# Patient Record
Sex: Female | Born: 1995
Health system: Southern US, Community
[De-identification: ages and names within clinical notes are randomized; demographics above are authoritative.]

## PROBLEM LIST (undated history)

## (undated) DIAGNOSIS — O039 Complete or unspecified spontaneous abortion without complication: Secondary | ICD-10-CM

## (undated) DIAGNOSIS — Z20828 Contact with and (suspected) exposure to other viral communicable diseases: Secondary | ICD-10-CM

## (undated) HISTORY — PX: WISDOM TOOTH EXTRACTION: SHX21

## (undated) HISTORY — DX: Complete or unspecified spontaneous abortion without complication: O03.9

---
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
## 2013-01-27 ENCOUNTER — Telehealth: Payer: Self-pay | Admitting: Family Medicine

## 2013-01-27 NOTE — Telephone Encounter (Signed)
Feel free to go ahead and write this out on a prescription. I will sign it. Then fax it with a brief cover letter stating that we have given her the shots in the past and we are requesting that [facility] assume the duty of this

## 2013-01-27 NOTE — Telephone Encounter (Signed)
Patient needs Rx for her depo provera sent to [employer]     [phone]

## 2013-01-28 NOTE — Telephone Encounter (Signed)
rx faxed  Pt notified

## 2013-04-15 ENCOUNTER — Telehealth: Payer: Self-pay | Admitting: Family Medicine

## 2013-04-15 ENCOUNTER — Other Ambulatory Visit: Payer: Self-pay | Admitting: *Deleted

## 2013-04-15 NOTE — Telephone Encounter (Signed)
Notified patient via VM stating we sent in the med. 

## 2013-04-15 NOTE — Telephone Encounter (Addendum)
Patient needs a refill of her depo provera faxed to student health at [employer]  Fax-[phone]

## 2013-06-02 ENCOUNTER — Telehealth: Payer: Self-pay | Admitting: Family Medicine

## 2013-06-02 MED ORDER — TRIAMCINOLONE ACETONIDE 0.1 % EX CREA: 1.0000 "application " | TOPICAL_CREAM | Freq: Two times a day (BID) | CUTANEOUS | Status: DC

## 2013-06-02 NOTE — Telephone Encounter (Signed)
triamcinolone cream (KENALOG) 0.1 %  Please refill

## 2013-06-02 NOTE — Telephone Encounter (Signed)
Not seen since Epic. 

## 2013-06-02 NOTE — Telephone Encounter (Signed)
Med sent to pharm. Pt notified.  

## 2013-06-02 NOTE — Telephone Encounter (Signed)
   I would mom is is being used for her eczema? Atopic dermatitis?. May prescribe 45 g tube apply twice a day when necessary 1 refill

## 2013-07-04 ENCOUNTER — Ambulatory Visit (INDEPENDENT_AMBULATORY_CARE_PROVIDER_SITE_OTHER): Payer: BC Managed Care – PPO | Admitting: *Deleted

## 2013-07-04 DIAGNOSIS — Z309 Encounter for contraceptive management, unspecified: Secondary | ICD-10-CM

## 2013-07-04 DIAGNOSIS — IMO0001 Reserved for inherently not codable concepts without codable children: Secondary | ICD-10-CM

## 2013-07-04 MED ORDER — MEDROXYPROGESTERONE ACETATE 150 MG/ML IM SUSP
150.0000 mg | Freq: Once | INTRAMUSCULAR | Status: AC
  Administered 2013-07-04: 150 mg via INTRAMUSCULAR

## 2013-08-14 ENCOUNTER — Encounter: Payer: Self-pay | Admitting: Orthopedic Surgery

## 2013-08-14 ENCOUNTER — Ambulatory Visit (INDEPENDENT_AMBULATORY_CARE_PROVIDER_SITE_OTHER): Payer: BC Managed Care – PPO | Admitting: Orthopedic Surgery

## 2013-08-14 DIAGNOSIS — M25562 Pain in left knee: Secondary | ICD-10-CM

## 2013-08-14 DIAGNOSIS — M25561 Pain in right knee: Secondary | ICD-10-CM

## 2013-08-14 DIAGNOSIS — M545 Low back pain, unspecified: Secondary | ICD-10-CM

## 2013-08-14 DIAGNOSIS — M25569 Pain in unspecified knee: Secondary | ICD-10-CM

## 2013-08-14 MED ORDER — IBUPROFEN 600 MG PO TABS: 600.0000 mg | ORAL_TABLET | Freq: Three times a day (TID) | ORAL | Status: DC

## 2013-08-14 NOTE — Progress Notes (Signed)
Patient ID: Kristine Lawrence, female   DOB: [birth year], 18 y.o.   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. Presents with several years of anterior knee pain previously thought to have Osgood-Schlatter's disease an overuse syndrome extensive bilateral anterior knee pain which is exacerbated by activity dancing running somewhat stairclimbing. Also complains of occasional numbness from mid tibia to proximal to mid thigh mild back pain back pain no posterior thigh or leg pain.  Review of systems otherwise normal other than stated complete system review was performed  The patient is awake alert oriented x3 mood affect normal she is able him bili without assistive devices   Physical findings  Excellent back flexibility but tenderness SI joints and L5-S1 increased lumbar lordosis normal popliteal angles  Noted hyperflexibility with hyperextension both knees bilateral pes planus flexible  Flat feet  Crepitance and quadriceps contraction test positive bilaterally tenderness at the medial patella facet right knee normal left knee  Knee range of motion normal  Patella stable cruciate ligament stable collateral ligament stable bilaterally  Normal muscle tone and strength in the quadriceps  No skin lesions in either leg or back  Normal reflexes normal sensation normal pulses bilaterally  Normal range of motion both hips  Normal balance  No lymphadenopathy  Diagnoses #1 anterior knee pain syndrome Diagnoses #2 lower back pain Diagnoses #3 pes planus flexible   Plan #1 physical therapy foot orthotics  Plan #2 physical therapy  Plan #3 foot orthotics to just pes planus and a cyst with overall limb alignment [facility] orthotics

## 2013-08-14 NOTE — Patient Instructions (Signed)
Call to arrange physical therapy 

## 2013-09-09 ENCOUNTER — Other Ambulatory Visit: Payer: Self-pay | Admitting: Family Medicine

## 2013-09-18 ENCOUNTER — Ambulatory Visit: Payer: BC Managed Care – PPO | Admitting: Orthopedic Surgery

## 2013-09-19 ENCOUNTER — Ambulatory Visit (INDEPENDENT_AMBULATORY_CARE_PROVIDER_SITE_OTHER): Payer: BC Managed Care – PPO | Admitting: *Deleted

## 2013-09-19 DIAGNOSIS — Z308 Encounter for other contraceptive management: Secondary | ICD-10-CM

## 2013-09-19 MED ORDER — MEDROXYPROGESTERONE ACETATE 150 MG/ML IM SUSP
150.0000 mg | Freq: Once | INTRAMUSCULAR | Status: AC
  Administered 2013-09-19: 150 mg via INTRAMUSCULAR

## 2013-09-24 ENCOUNTER — Ambulatory Visit (HOSPITAL_COMMUNITY): Payer: BC Managed Care – PPO | Attending: Orthopedic Surgery | Admitting: Physical Therapy

## 2013-11-05 ENCOUNTER — Other Ambulatory Visit: Payer: Self-pay | Admitting: Family Medicine

## 2013-11-24 ENCOUNTER — Encounter: Payer: Self-pay | Admitting: Family Medicine

## 2013-11-24 ENCOUNTER — Ambulatory Visit (INDEPENDENT_AMBULATORY_CARE_PROVIDER_SITE_OTHER): Payer: BC Managed Care – PPO | Admitting: Family Medicine

## 2013-11-24 VITALS — BP 110/78 | Ht 61.0 in | Wt 137.0 lb

## 2013-11-24 DIAGNOSIS — R21 Rash and other nonspecific skin eruption: Secondary | ICD-10-CM

## 2013-11-24 MED ORDER — NORETHIN-ETH ESTRAD TRIPHASIC 0.5/0.75/1-35 MG-MCG PO TABS: 1.0000 | ORAL_TABLET | Freq: Every day | ORAL | Status: DC

## 2013-11-24 MED ORDER — NORETHIN-ETH ESTRAD TRIPHASIC 0.5/0.75/1-35 MG-MCG PO TABS
1.0000 | ORAL_TABLET | Freq: Every day | ORAL | Status: DC
Start: 2013-11-24 — End: 2014-10-28

## 2013-11-24 NOTE — Progress Notes (Signed)
   Subjective:    Patient ID: Kristine Lawrence, female    DOB: 25-Jun-1995, 18 y.o.   MRN: [id number]  HPI Kristine Lawrence Is here today. Patient is here today to discuss Birth Control.Patient has no other concerns.  Nursing major  Thing going well    Doing dance,  Depo since a freshman. Patient thinks and Depakote is leading to weight gain. Weight gain has been on ongoing concern even before starting any type of birth control. Patient actively dance. Also watching her diet closely.  Leads to not being able to lose weight   Review of Systems No headache no chest pain no back pain ROS otherwise negative    Objective:   Physical Exam  Alert no apparent distress vitals stable. Lungs clear. Heart regular rate and rhythm.      Assessment & Plan:  Impression 1 birth control discussed at length plan trial of low-dose oral contraceptives. Proper use discussed at length. WSL

## 2013-11-26 ENCOUNTER — Ambulatory Visit: Payer: BC Managed Care – PPO | Admitting: Nurse Practitioner

## 2013-12-26 ENCOUNTER — Other Ambulatory Visit: Payer: Self-pay | Admitting: Family Medicine

## 2014-01-22 ENCOUNTER — Other Ambulatory Visit: Payer: Self-pay | Admitting: Family Medicine

## 2014-02-16 ENCOUNTER — Other Ambulatory Visit: Payer: Self-pay | Admitting: Family Medicine

## 2014-07-21 ENCOUNTER — Other Ambulatory Visit: Payer: Self-pay | Admitting: Family Medicine

## 2014-07-21 NOTE — Telephone Encounter (Signed)
Needs office visit.

## 2014-07-22 ENCOUNTER — Telehealth: Payer: Self-pay | Admitting: Family Medicine

## 2014-07-22 DIAGNOSIS — Z139 Encounter for screening, unspecified: Secondary | ICD-10-CM

## 2014-07-22 NOTE — Telephone Encounter (Signed)
bw order put in and pt notified.

## 2014-07-22 NOTE — Telephone Encounter (Signed)
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  Can we order so patient can have drawn here at [facility]? Please advise and call pt when done

## 2014-07-24 LAB — HEPATITIS B SURFACE ANTIBODY, QUANTITATIVE: Hepatitis B Surf Ab Quant: >1000 m[IU]/mL (ref >9.9)

## 2014-09-15 ENCOUNTER — Other Ambulatory Visit: Payer: Self-pay | Admitting: Family Medicine

## 2014-10-28 ENCOUNTER — Other Ambulatory Visit: Payer: Self-pay | Admitting: Family Medicine

## 2014-11-26 ENCOUNTER — Ambulatory Visit (INDEPENDENT_AMBULATORY_CARE_PROVIDER_SITE_OTHER): Payer: BLUE CROSS/BLUE SHIELD | Admitting: Family Medicine

## 2014-11-26 VITALS — BP 126/80 | Ht 61.0 in | Wt 135.5 lb

## 2014-11-26 DIAGNOSIS — Z3040 Encounter for surveillance of contraceptives, unspecified: Secondary | ICD-10-CM | POA: Diagnosis not present

## 2014-11-26 MED ORDER — NORETHIN-ETH ESTRAD TRIPHASIC 0.5/0.75/1-35 MG-MCG PO TABS: 1.0000 | ORAL_TABLET | Freq: Every day | ORAL | Status: DC

## 2014-11-26 NOTE — Progress Notes (Signed)
   Subjective:    Patient ID: Kristine Lawrence, female    DOB: December 13, 1995, 19 y.o.   MRN: [id number]  HPI  Patient in today for to discuss current contraceptive and get a renewal on the prescription. No medical issues  Staying activ e and ddncing   Tolerating well  Non smoking   Patient states no other concerns this visit.   Review of Systems No headache no chest pain no back pain no shortness of breath    Objective:   Physical Exam Alert vitals stable no acute distress HEENT normal. Lungs clear. Heart regular in rhythm.       Assessment & Plan:  Impression oral contraceptives discussed at length including proper use plan birth control advice prescription given next visit next year will require complete physical at age greater than 4121 discussed

## 2015-02-12 ENCOUNTER — Encounter: Payer: Self-pay | Admitting: Nurse Practitioner

## 2015-02-12 ENCOUNTER — Ambulatory Visit (INDEPENDENT_AMBULATORY_CARE_PROVIDER_SITE_OTHER): Payer: BLUE CROSS/BLUE SHIELD | Admitting: Nurse Practitioner

## 2015-02-12 VITALS — BP 106/70 | Temp 98.2°F | Ht 61.5 in | Wt 135.2 lb

## 2015-02-12 DIAGNOSIS — Z124 Encounter for screening for malignant neoplasm of cervix: Secondary | ICD-10-CM

## 2015-02-12 DIAGNOSIS — Z113 Encounter for screening for infections with a predominantly sexual mode of transmission: Secondary | ICD-10-CM

## 2015-02-12 DIAGNOSIS — Z0000 Encounter for general adult medical examination without abnormal findings: Secondary | ICD-10-CM | POA: Diagnosis not present

## 2015-02-12 DIAGNOSIS — Z23 Encounter for immunization: Secondary | ICD-10-CM | POA: Diagnosis not present

## 2015-02-15 ENCOUNTER — Encounter: Payer: Self-pay | Admitting: Nurse Practitioner

## 2015-02-15 NOTE — Progress Notes (Signed)
   Subjective:    Patient ID: Kristine Lawrence, female    DOB: [birth year], 20 y.o.   MRN: [id number]  HPI presents for her wellness physical. Grandmother present per her request. Regular menses, normal flow. Now sexually active, one partner. Active at least 5 days per week. Doing well in college. Regular vision and dental exams.     Review of Systems  Constitutional: Negative for fever, activity change, appetite change and fatigue.  HENT: Negative for dental problem, ear pain, sinus pressure and sore throat.   Respiratory: Negative for cough, chest tightness, shortness of breath and wheezing.   Cardiovascular: Negative for chest pain.  Gastrointestinal: Negative for nausea, vomiting, abdominal pain, diarrhea, constipation and abdominal distention.  Genitourinary: Negative for dysuria, urgency, frequency, vaginal discharge, enuresis, difficulty urinating, genital sores, menstrual problem and pelvic pain.       Objective:   Physical Exam  Constitutional: She is oriented to person, place, and time. She appears well-developed. No distress.  HENT:  Right Ear: External ear normal.  Left Ear: External ear normal.  Mouth/Throat: Oropharynx is clear and moist.  Neck: Normal range of motion. Neck supple. No tracheal deviation present. No thyromegaly present.  Cardiovascular: Normal rate, regular rhythm and normal heart sounds.  Exam reveals no gallop.   No murmur heard. Pulmonary/Chest: Effort normal and breath sounds normal.  Abdominal: Soft. She exhibits no distension. There is no tenderness.  Genitourinary: Vagina normal and uterus normal. No vaginal discharge found.  External GU: No rashes or lesions noted. Vagina minimal mucoid discharge noted. No CMT. Bimanual exam no tenderness or obvious masses noted.  Musculoskeletal: She exhibits no edema.  Lymphadenopathy:    She has no cervical adenopathy.  Neurological: She is alert and oriented to person, place, and time.  Skin: Skin is warm and  dry. No rash noted.  Psychiatric: She has a normal mood and affect. Her behavior is normal.  Vitals reviewed. Breast exam: dense tissue with fine nodularity; no masses; axillae no adenopathy.       Assessment & Plan:  Routine general medical examination at a health care facility - Plan: Pap IG and Chlamydia/Gonococcus, NAA  Screening for cervical cancer - Plan: Pap IG and Chlamydia/Gonococcus, NAA  Screen for STD (sexually transmitted disease) - Plan: Pap IG and Chlamydia/Gonococcus, NAA  Need for vaccination - Plan: HPV 9-valent vaccine,Recombinat (Gardasil 9)  Reviewed anticipatory guidance appropriate for her age including safety and safe sex issues. Return in about 1 year (around 02/12/2016) for physical.

## 2015-02-16 LAB — PAP IG AND CT-NG NAA
Chlamydia, Nuc. Acid Amp: NEGATIVE
Gonococcus by Nucleic Acid Amp: NEGATIVE
PAP Smear Comment: 0

## 2015-06-18 ENCOUNTER — Ambulatory Visit (INDEPENDENT_AMBULATORY_CARE_PROVIDER_SITE_OTHER): Payer: BLUE CROSS/BLUE SHIELD

## 2015-06-18 DIAGNOSIS — Z23 Encounter for immunization: Secondary | ICD-10-CM | POA: Diagnosis not present

## 2015-07-27 ENCOUNTER — Ambulatory Visit (INDEPENDENT_AMBULATORY_CARE_PROVIDER_SITE_OTHER): Payer: BLUE CROSS/BLUE SHIELD | Admitting: *Deleted

## 2015-07-27 DIAGNOSIS — Z111 Encounter for screening for respiratory tuberculosis: Secondary | ICD-10-CM

## 2015-07-29 LAB — TB SKIN TEST
Induration: 0 mm
TB Skin Test: NEGATIVE

## 2015-08-23 ENCOUNTER — Other Ambulatory Visit: Payer: Self-pay | Admitting: Family Medicine

## 2015-11-27 ENCOUNTER — Other Ambulatory Visit: Payer: Self-pay | Admitting: Family Medicine

## 2015-11-29 ENCOUNTER — Telehealth: Payer: Self-pay | Admitting: Nurse Practitioner

## 2015-11-29 MED ORDER — NORETHIN-ETH ESTRAD TRIPHASIC 0.5/0.75/1-35 MG-MCG PO TABS: 1.0000 | ORAL_TABLET | Freq: Every day | ORAL | 2 refills | Status: DC

## 2015-11-29 NOTE — Telephone Encounter (Signed)
Spoke with patient and informed her per protocol that refills were sent in on her requested medication. Patient verbalized understanding.

## 2015-11-29 NOTE — Telephone Encounter (Signed)
Pt is needing refills on her norethindrone-ethinyl estradiol (NECON 7/7/7) 0.5/0.75/1-35 MG-MCG tablet  Pt is not due for a physical till Dec.  [facility] [address] [place]

## 2016-01-26 ENCOUNTER — Other Ambulatory Visit: Payer: Self-pay | Admitting: Family Medicine

## 2016-02-11 ENCOUNTER — Other Ambulatory Visit: Payer: Self-pay | Admitting: Family Medicine

## 2016-02-28 ENCOUNTER — Encounter: Payer: Self-pay | Admitting: Nurse Practitioner

## 2016-03-08 ENCOUNTER — Ambulatory Visit (INDEPENDENT_AMBULATORY_CARE_PROVIDER_SITE_OTHER): Payer: Managed Care, Other (non HMO) | Admitting: Nurse Practitioner

## 2016-03-08 ENCOUNTER — Telehealth: Payer: Self-pay | Admitting: Family Medicine

## 2016-03-08 ENCOUNTER — Encounter: Payer: Self-pay | Admitting: Nurse Practitioner

## 2016-03-08 ENCOUNTER — Other Ambulatory Visit: Payer: Self-pay | Admitting: Nurse Practitioner

## 2016-03-08 VITALS — BP 104/76 | Ht 61.5 in | Wt 146.0 lb

## 2016-03-08 DIAGNOSIS — Z01419 Encounter for gynecological examination (general) (routine) without abnormal findings: Secondary | ICD-10-CM | POA: Diagnosis not present

## 2016-03-08 MED ORDER — NORETHIN-ETH ESTRAD TRIPHASIC 0.5/0.75/1-35 MG-MCG PO TABS: 1.0000 | ORAL_TABLET | Freq: Every day | ORAL | 11 refills | Status: DC

## 2016-03-08 NOTE — Telephone Encounter (Signed)
Pt needs 1 year refill on her  NECON 7/7/7 0.5/0.75/1-35 MG-MCG tablet  Please call when done     [facility]

## 2016-03-08 NOTE — Progress Notes (Signed)
   Subjective:    Patient ID: Kristine Lawrence, female    DOB: 1995/07/05, 21 y.o.   MRN: [id number]  HPI presents for her wellness exam. No new sexual partners. Healthy diet. Regular dental care. Regular exercise. 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 Regular cycles, normal flow.     Review of Systems  Constitutional: Negative for activity change, appetite change and fatigue.  HENT: Negative for dental problem, ear pain, sinus pressure and sore throat.   Eyes: Negative for visual disturbance.  Respiratory: Negative for cough, chest tightness, shortness of breath and wheezing.   Cardiovascular: Negative for chest pain.  Gastrointestinal: Negative for abdominal distention, abdominal pain, constipation, diarrhea, nausea and vomiting.  Genitourinary: Negative for difficulty urinating, dysuria, enuresis, frequency, genital sores, menstrual problem, pelvic pain, urgency and vaginal discharge.       Objective:   Physical Exam  Constitutional: She is oriented to person, place, and time. She appears well-developed. No distress.  HENT:  Right Ear: External ear normal.  Left Ear: External ear normal.  Mouth/Throat: Oropharynx is clear and moist.  Neck: Normal range of motion. Neck supple. No tracheal deviation present. No thyromegaly present.  Cardiovascular: Normal rate, regular rhythm and normal heart sounds.  Exam reveals no gallop.   No murmur heard. Pulmonary/Chest: Effort normal and breath sounds normal.  Abdominal: Soft. She exhibits no distension. There is no tenderness.  Genitourinary: Vagina normal and uterus normal. No vaginal discharge found.  Genitourinary Comments: External GU: no rashes or lesions. Vagina: minimal white mucoid discharge. Bimanual exam: no CMT. Uterus and adnexa non tender; no obvious masses.   Musculoskeletal: She exhibits no edema.  Lymphadenopathy:    She has no cervical adenopathy.  Neurological: She is alert and oriented to person, place, and  time.  Skin: Skin is warm and dry. No rash noted.  Psychiatric: She has a normal mood and affect. Her behavior is normal.  Breast exam: dense tissue; no masses; axillae no adenopathy.        Assessment & Plan:  Well woman exam  Continue exercise and weight loss efforts. Continue oc as directed.  Return in about 1 year (around 03/08/2017) for physical.

## 2016-03-20 ENCOUNTER — Encounter: Payer: Self-pay | Admitting: Family Medicine

## 2016-03-20 ENCOUNTER — Ambulatory Visit (INDEPENDENT_AMBULATORY_CARE_PROVIDER_SITE_OTHER): Payer: Managed Care, Other (non HMO) | Admitting: Family Medicine

## 2016-03-20 VITALS — BP 108/72 | Temp 98.6°F | Ht 61.5 in | Wt 146.4 lb

## 2016-03-20 DIAGNOSIS — J111 Influenza due to unidentified influenza virus with other respiratory manifestations: Secondary | ICD-10-CM | POA: Diagnosis not present

## 2016-03-20 MED ORDER — OSELTAMIVIR PHOSPHATE 75 MG PO CAPS: 75.0000 mg | ORAL_CAPSULE | Freq: Two times a day (BID) | ORAL | 0 refills | Status: DC

## 2016-03-20 NOTE — Patient Instructions (Signed)

## 2016-03-20 NOTE — Progress Notes (Signed)
   Subjective:    Patient ID: Kristine Lawrence, female    DOB: 06-Nov-1995, 21 y.o.   MRN: [id number]  Headache   This is a new problem. The current episode started today. The problem occurs intermittently. The problem has been unchanged. The pain does not radiate. The quality of the pain is described as aching. The pain is moderate. Associated symptoms include nausea. Associated symptoms comments: fatigue. Nothing aggravates the symptoms. She has tried NSAIDs for the symptoms. The treatment provided mild relief.   Patient states that she has no other concerns at this time.   Patient was around family members had the flu last week. Then she came down with headache fatigue tiredness feeling bad. Denies sore throat cough or wheeze. She states she would like to go ahead and be treated to prevent hitting into a more severe case of the flu. Review of Systems  Gastrointestinal: Positive for nausea.  Neurological: Positive for headaches.       Objective:   Physical Exam Patient does not appear toxic eardrums normal throat is normal neck no masses lungs are clear hearts regular       Assessment & Plan:  Influenza-the patient was diagnosed with influenza. Patient/family educated about the flu and warning signs to watch for. If difficulty breathing, severe neck pain and stiffness, cyanosis, disorientation, or progressive worsening then immediately get rechecked at that ER. If progressive symptoms be certain to be rechecked. Supportive measures such as Tylenol/ibuprofen was discussed. No aspirin use in children. And influenza home care instruction sheet was given. Early influenza. Warning signs discussed follow-up if problems out

## 2016-12-11 ENCOUNTER — Other Ambulatory Visit: Payer: Self-pay | Admitting: Family Medicine

## 2017-01-19 ENCOUNTER — Telehealth: Payer: Self-pay | Admitting: Nurse Practitioner

## 2017-01-19 NOTE — Telephone Encounter (Signed)
Yes. This IUD has been out for many years. It does not have progesterone. Can provide contraception up to at least 10 years. We do not insert them here but there are 2 nurse midwives across the hall from us that are excellent. Please give phone number for [facility] if needed. Tell her to ask for appointment with Drenda FreezeFran or Selena BattenKim.

## 2017-01-19 NOTE — Telephone Encounter (Signed)
Patient aware and [facility] Number given to the pt.

## 2017-01-19 NOTE — Telephone Encounter (Signed)
Patient called states she researching a new method of birth control. It is called para gaurd it is a copper IUD with no hormones. She wanted to know if you had ever heard of it and if you would suggest it. She wanted to know if so could you insert it.I told her that may be something an OBGYN would have to do. Please advise.

## 2017-01-19 NOTE — Telephone Encounter (Signed)
Patient is requesting a nurse to give her a call back regarding questions she has about birth control.

## 2017-01-30 ENCOUNTER — Ambulatory Visit (INDEPENDENT_AMBULATORY_CARE_PROVIDER_SITE_OTHER): Payer: Managed Care, Other (non HMO) | Admitting: Advanced Practice Midwife

## 2017-01-30 ENCOUNTER — Encounter: Payer: Self-pay | Admitting: Advanced Practice Midwife

## 2017-01-30 VITALS — BP 118/60 | Ht 62.0 in | Wt 151.0 lb

## 2017-01-30 DIAGNOSIS — Z30018 Encounter for initial prescription of other contraceptives: Secondary | ICD-10-CM

## 2017-01-30 DIAGNOSIS — Z30014 Encounter for initial prescription of intrauterine contraceptive device: Secondary | ICD-10-CM

## 2017-01-30 DIAGNOSIS — Z3009 Encounter for other general counseling and advice on contraception: Secondary | ICD-10-CM

## 2017-01-30 MED ORDER — MISOPROSTOL 200 MCG PO TABS: ORAL_TABLET | ORAL | 2 refills | Status: DC

## 2017-01-30 NOTE — Progress Notes (Signed)
[facility]  Patient name: Kristine Lawrence MRN [id number]  Date of birth: 03/20/1995  CC & HPI:  Kristine Lawrence is a 21 y.o. African American female presenting today for discussion of paragard.  She had been on depo (made hair fall out) and now COCs (thinks it has made her gain weight).  DId some research and wants non hormonal IUD.  Is nulliparous, [facility]. Kristine ShownGrandmother is here with her today and both are a delight to talk to.   Pertinent History Reviewed:  Medical & Surgical Hx:   Past Medical History:  Diagnosis Date  . Mono exposure    Past Surgical History:  Procedure Laterality Date  . WISDOM TOOTH EXTRACTION     Family History  Problem Relation Age of Onset  . Diabetes Paternal Grandfather   . Diabetes Paternal Grandmother   . Hypertension Maternal Grandmother   . Hypertension Maternal Grandfather     Current Outpatient Medications:  .  acetaminophen (TYLENOL) 500 MG tablet, Take 1,000 mg by mouth every 6 (six) hours as needed. For pain and fever , Disp: , Rfl:  .  norethindrone-ethinyl estradiol (NECON 7/7/7) 0.5/0.75/1-35 MG-MCG tablet, Take 1 tablet by mouth daily., Disp: 28 tablet, Rfl: 11 Social History: Reviewed -  reports that  has never smoked. she has never used smokeless tobacco.  Review of Systems:   Constitutional: Negative for fever and chills Eyes: Negative for visual disturbances Respiratory: Negative for shortness of breath, dyspnea Cardiovascular: Negative for chest pain or palpitations  Gastrointestinal: Negative for vomiting, diarrhea and constipation; no abdominal pain Genitourinary: Negative for dysuria and urgency, vaginal irritation or itching Musculoskeletal: Negative for back pain, joint pain, myalgias  Neurological: Negative for dizziness and headaches    Objective Findings:    Physical Examination: General appearance - well appearing, and in no distress Mental status - alert, oriented to person, place, and  time Chest:  Normal respiratory effort Heart - normal rate and regular rhythm Abdomen:  Soft, nontender Pelvic: deferred Musculoskeletal:  Normal range of motion without pain Extremities:  No edema  Discussed Risks/benefits/side effects of Paragard, including possibly being difficult to insert d/t nulliparity  Discussed Lysteda prn menorrhagia. Pt chooses to proceed.     No results found for this or any previous visit (from the past 24 hour(s)).    Assessment & Plan:  A:   Contraception counseling P:  Plan paragard for when on period (on coc's so will be predictable)  Premedicate w/cytotedc    CRESENZO-DISHMAN,Kristine Lawrence CNM 01/30/2017 11:22 AM

## 2017-02-15 ENCOUNTER — Other Ambulatory Visit: Payer: Self-pay

## 2017-02-15 ENCOUNTER — Ambulatory Visit (INDEPENDENT_AMBULATORY_CARE_PROVIDER_SITE_OTHER): Payer: Managed Care, Other (non HMO) | Admitting: Women's Health

## 2017-02-15 ENCOUNTER — Encounter: Payer: Self-pay | Admitting: Women's Health

## 2017-02-15 VITALS — BP 112/74 | HR 75 | Ht 62.0 in | Wt 150.0 lb

## 2017-02-15 DIAGNOSIS — Z30430 Encounter for insertion of intrauterine contraceptive device: Secondary | ICD-10-CM | POA: Diagnosis not present

## 2017-02-15 DIAGNOSIS — Z3202 Encounter for pregnancy test, result negative: Secondary | ICD-10-CM

## 2017-02-15 DIAGNOSIS — Z113 Encounter for screening for infections with a predominantly sexual mode of transmission: Secondary | ICD-10-CM

## 2017-02-15 LAB — POCT URINE PREGNANCY: Preg Test, Ur: NEGATIVE

## 2017-02-15 MED ORDER — PARAGARD INTRAUTERINE COPPER IU IUD
1.0000 [IU] | INTRAUTERINE_SYSTEM | Freq: Once | INTRAUTERINE | Status: AC
  Administered 2017-02-15: 1 [IU] via INTRAUTERINE

## 2017-02-15 NOTE — Addendum Note (Signed)
Addended by: Tish FredericksonLANCASTER, Kaikoa Magro A on: 02/15/2017 03:05 PM   Modules accepted: Orders

## 2017-02-15 NOTE — Patient Instructions (Signed)
 Nothing in vagina for 3 days (no sex, douching, tampons, etc...)  Check your strings once a month to make sure you can feel them, if you are not able to please let us know  If you develop a fever of 100.4 or more in the next few weeks, or if you develop severe abdominal pain, please let us know  Use a backup method of birth control, such as condoms, for 2 weeks    Intrauterine Device Insertion, Care After This sheet gives you information about how to care for yourself after your procedure. Your health care provider may also give you more specific instructions. If you have problems or questions, contact your health care provider. What can I expect after the procedure? After the procedure, it is common to have:  Cramps and pain in the abdomen.  Light bleeding (spotting) or heavier bleeding that is like your menstrual period. This may last for up to a few days.  Lower back pain.  Dizziness.  Headaches.  Nausea.  Follow these instructions at home:  Before resuming sexual activity, check to make sure that you can feel the IUD string(s). You should be able to feel the end of the string(s) below the opening of your cervix. If your IUD string is in place, you may resume sexual activity. ? If you had a hormonal IUD inserted more than 7 days after your most recent period started, you will need to use a backup method of birth control for 7 days after IUD insertion. Ask your health care provider whether this applies to you.  Continue to check that the IUD is still in place by feeling for the string(s) after every menstrual period, or once a month.  Take over-the-counter and prescription medicines only as told by your health care provider.  Do not drive or use heavy machinery while taking prescription pain medicine.  Keep all follow-up visits as told by your health care provider. This is important. Contact a health care provider if:  You have bleeding that is heavier or lasts longer than  a normal menstrual cycle.  You have a fever.  You have cramps or abdominal pain that get worse or do not get better with medicine.  You develop abdominal pain that is new or is not in the same area of earlier cramping and pain.  You feel lightheaded or weak.  You have abnormal or bad-smelling discharge from your vagina.  You have pain during sexual activity.  You have any of the following problems with your IUD string(s): ? The string bothers or hurts you or your sexual partner. ? You cannot feel the string. ? The string has gotten longer.  You can feel the IUD in your vagina.  You think you may be pregnant, or you miss your menstrual period.  You think you may have an STI (sexually transmitted infection). Get help right away if:  You have flu-like symptoms.  You have a fever and chills.  You can feel that your IUD has slipped out of place. Summary  After the procedure, it is common to have cramps and pain in the abdomen. It is also common to have light bleeding (spotting) or heavier bleeding that is like your menstrual period.  Continue to check that the IUD is still in place by feeling for the string(s) after every menstrual period, or once a month.  Keep all follow-up visits as told by your health care provider. This is important.  Contact your health care provider if   you have problems with your IUD string(s), such as the string getting longer or bothering you or your sexual partner. This information is not intended to replace advice given to you by your health care provider. Make sure you discuss any questions you have with your health care provider. Document Released: 09/28/2010 Document Revised: 12/22/2015 Document Reviewed: 12/22/2015 Elsevier Interactive Patient Education  2017 [employer]vier Inc[employer]  

## 2017-02-15 NOTE — Progress Notes (Signed)
   IUD INSERTION Patient name: Kristine Lawrence MRN [id number]  Date of birth: 09/06/1995 Subjective Findings:   Kristine Lawrence is a 22 y.o. G0P0000 African American female being seen today for insertion of a Paragard IUD. On period. Took cytotec as directed this am.   Patient's last menstrual period was 02/14/2017. Last sexual intercourse was 4 days ago, however her period started yesterday Last pap12/30/16. Results were:  normal  The risks and benefits of the method and placement have been thouroughly reviewed with the patient and all questions were answered.  Specifically the patient is aware of failure rate of 02/998, expulsion of the IUD and of possible perforation.  The patient is aware of irregular bleeding due to the method and understands the incidence of irregular bleeding diminishes with time.  Signed copy of informed consent in chart.  Pertinent History Reviewed:   Reviewed past medical,surgical, social, obstetrical and family history.  Reviewed problem list, medications and allergies. Objective Findings & Procedure:   Vitals:   02/15/17 1403  BP: 112/74  Pulse: 75  Weight: 150 lb (68 kg)  Height: 5\' 2"  (1.575 m)  Body mass index is 27.44 kg/m.  Results for orders placed or performed in visit on 02/15/17 (from the past 24 hour(s))  POCT urine pregnancy   Collection Time: 02/15/17  2:10 PM  Result Value Ref Range   Preg Test, Ur Negative Negative     Time out was performed.  A graves speculum was placed in the vagina.  The cervix was visualized, prepped using Betadine, and grasped with a single tooth tenaculum. The uterus was found to be neutral and it sounded to 7 cm.  Paragard IUD placed per manufacturer's recommendations. The strings were trimmed to approximately 3 cm. The patient tolerated the procedure well.   Informal transvaginal sonogram was performed by myself and amber, ultrasonographer and the proper placement of the IUD was verified. Assessment & Plan:   1)  Paragard IUD insertion The patient was given post procedure instructions, including signs and symptoms of infection and to check for the strings after each menses or each month, and refraining from intercourse or anything in the vagina for 3 days. She was given a Paragard care card with date IUD placed, and date IUD to be removed. She is scheduled for a f/u appointment in 4 weeks. Condoms always for STI prevention Will check gc/ct, hasn't had in 2 years  Orders Placed This Encounter  Procedures  . GC/Chlamydia Probe Amp  . POCT urine pregnancy    Return in about 4 weeks (around 03/15/2017) for F/U.  Marge DuncansBooker, Kamilo Och Randall CNM, [facility] 02/15/2017 2:49 PM

## 2017-02-27 ENCOUNTER — Emergency Department (HOSPITAL_COMMUNITY)
Admission: EM | Admit: 2017-02-27 | Discharge: 2017-02-27 | Disposition: A | Discharge status: Home / Self Care | Payer: Managed Care, Other (non HMO) | Attending: Emergency Medicine | Admitting: Emergency Medicine

## 2017-02-27 ENCOUNTER — Encounter (HOSPITAL_COMMUNITY): Payer: Self-pay | Admitting: Emergency Medicine

## 2017-02-27 ENCOUNTER — Ambulatory Visit: Payer: Managed Care, Other (non HMO) | Admitting: Family Medicine

## 2017-02-27 ENCOUNTER — Other Ambulatory Visit: Payer: Self-pay

## 2017-02-27 ENCOUNTER — Emergency Department (HOSPITAL_COMMUNITY): Payer: Managed Care, Other (non HMO)

## 2017-02-27 DIAGNOSIS — E860 Dehydration: Secondary | ICD-10-CM | POA: Diagnosis not present

## 2017-02-27 DIAGNOSIS — Z79899 Other long term (current) drug therapy: Secondary | ICD-10-CM | POA: Diagnosis not present

## 2017-02-27 DIAGNOSIS — R4020 Unspecified coma: Secondary | ICD-10-CM

## 2017-02-27 DIAGNOSIS — R55 Syncope and collapse: Secondary | ICD-10-CM | POA: Insufficient documentation

## 2017-02-27 LAB — URINALYSIS, ROUTINE W REFLEX MICROSCOPIC
BILIRUBIN URINE: NEGATIVE
Bacteria, UA: NONE SEEN
GLUCOSE, UA: NEGATIVE mg/dL
Hgb urine dipstick: NEGATIVE
KETONES UR: NEGATIVE mg/dL
Nitrite: NEGATIVE
PH: 7 (ref 5.0–8.0)
Protein, ur: 30 mg/dL — AB
Specific Gravity, Urine: 1.03 (ref 1.005–1.030)

## 2017-02-27 LAB — COMPREHENSIVE METABOLIC PANEL
ALBUMIN: 3.5 g/dL (ref 3.5–5.0)
ALT: 38 U/L (ref 14–54)
ANION GAP: 11 (ref 5–15)
AST: 27 U/L (ref 15–41)
Alkaline Phosphatase: 40 U/L (ref 38–126)
BUN: 13 mg/dL (ref 6–20)
CHLORIDE: 104 mmol/L (ref 101–111)
CO2: 20 mmol/L — AB (ref 22–32)
CREATININE: 0.98 mg/dL (ref 0.44–1.00)
Calcium: 9 mg/dL (ref 8.9–10.3)
GFR calc non Af Amer: >60 mL/min (ref >60)
Glucose, Bld: 123 mg/dL — ABNORMAL HIGH (ref 65–99)
Potassium: 3.5 mmol/L (ref 3.5–5.1)
SODIUM: 135 mmol/L (ref 135–145)
Total Bilirubin: 0.5 mg/dL (ref 0.3–1.2)
Total Protein: 6.4 g/dL — ABNORMAL LOW (ref 6.5–8.1)

## 2017-02-27 LAB — CBC WITH DIFFERENTIAL/PLATELET
BASOS PCT: 0 %
Basophils Absolute: 0 10*3/uL (ref 0.0–0.1)
EOS ABS: 0 10*3/uL (ref 0.0–0.7)
EOS PCT: 0 %
HCT: 38.4 % (ref 36.0–46.0)
Hemoglobin: 12.9 g/dL (ref 12.0–15.0)
LYMPHS ABS: 1.2 10*3/uL (ref 0.7–4.0)
Lymphocytes Relative: 14 %
MCH: 29.5 pg (ref 26.0–34.0)
MCHC: 33.6 g/dL (ref 30.0–36.0)
MCV: 87.9 fL (ref 78.0–100.0)
Monocytes Absolute: 1.2 10*3/uL — ABNORMAL HIGH (ref 0.1–1.0)
Monocytes Relative: 14 %
Neutro Abs: 5.9 10*3/uL (ref 1.7–7.7)
Neutrophils Relative %: 72 %
PLATELETS: 193 10*3/uL (ref 150–400)
RBC: 4.37 MIL/uL (ref 3.87–5.11)
RDW: 12.4 % (ref 11.5–15.5)
WBC: 8.3 10*3/uL (ref 4.0–10.5)

## 2017-02-27 LAB — WET PREP, GENITAL
SPERM: NONE SEEN
Trich, Wet Prep: NONE SEEN
Yeast Wet Prep HPF POC: NONE SEEN

## 2017-02-27 LAB — INFLUENZA PANEL BY PCR (TYPE A & B)
Influenza A By PCR: POSITIVE — AB
Influenza B By PCR: NEGATIVE

## 2017-02-27 LAB — I-STAT BETA HCG BLOOD, ED (MC, WL, AP ONLY): I-stat hCG, quantitative: <5 m[IU]/mL (ref <5)

## 2017-02-27 MED ORDER — SODIUM CHLORIDE 0.9 % IV BOLUS (SEPSIS)
1000.0000 mL | Freq: Once | INTRAVENOUS | Status: AC
  Administered 2017-02-27: 1000 mL via INTRAVENOUS

## 2017-02-27 MED ORDER — OSELTAMIVIR PHOSPHATE 75 MG PO CAPS
75.0000 mg | ORAL_CAPSULE | Freq: Once | ORAL | Status: AC
  Administered 2017-02-27: 75 mg via ORAL
  Filled 2017-02-27: qty 1

## 2017-02-27 MED ORDER — ACETAMINOPHEN 325 MG PO TABS
650.0000 mg | ORAL_TABLET | Freq: Once | ORAL | Status: AC
  Administered 2017-02-27: 650 mg via ORAL
  Filled 2017-02-27: qty 2

## 2017-02-27 MED ORDER — METRONIDAZOLE 500 MG PO TABS: 500.0000 mg | ORAL_TABLET | Freq: Two times a day (BID) | ORAL | 0 refills | Status: DC

## 2017-02-27 MED ORDER — ONDANSETRON 4 MG PO TBDP: ORAL_TABLET | ORAL | 0 refills | Status: DC

## 2017-02-27 MED ORDER — OSELTAMIVIR PHOSPHATE 75 MG PO CAPS: 75.0000 mg | ORAL_CAPSULE | Freq: Two times a day (BID) | ORAL | 0 refills | Status: DC

## 2017-02-27 MED ORDER — SODIUM CHLORIDE 0.9 % IV BOLUS (SEPSIS)
2000.0000 mL | Freq: Once | INTRAVENOUS | Status: AC
  Administered 2017-02-27: 2000 mL via INTRAVENOUS

## 2017-02-27 NOTE — ED Notes (Signed)
EDP at beside updating patient and family. 

## 2017-02-27 NOTE — ED Provider Notes (Signed)
22 y/o female G0P0 presenting with Left lower abdominal pain since yesterday, fever and syncope. Patient preferred female provider for pelvic exam. She reports foul odor and discharge since IUD placement.  Patient had IUD placed on 02/15/17 LMP 02/14/17  Pelvic exam only:  Left adnexal tenderness, no mass appreciate on bimanual exam. No cervical motion tenderness. Thick yellow cervical discharge. Unable to clearly confirm visualization of the IUD cords. GC/Chlam and wep prep samples collected.  Recommended pelvic US to r/o TOA and confirm IUD placement. Discussed with Dr. Estell HarpinZammit.   Georgiana ShoreMitchell, Johnryan Sao B, PA-C 02/27/17 1147    Bethann BerkshireZammit, Joseph, MD 02/27/17 1447

## 2017-02-27 NOTE — ED Provider Notes (Signed)
[facility] Provider Note   CSN: [id number] Arrival date & time: 02/27/17  1031     History   Chief Complaint Chief Complaint  Patient presents with  . Loss of Consciousness    HPI Kristine Lawrence is a 22 y.o. female.  Patient complains of fever nausea and then she passed out today.  Patient also has aches all over   The history is provided by the patient.  Loss of Consciousness   This is a new problem. The current episode started 3 to 5 hours ago. The problem occurs rarely. The problem has been resolved. She lost consciousness for a period of less than one minute. The problem is associated with normal activity. Associated symptoms include fever and nausea. Pertinent negatives include abdominal pain, back pain, chest pain, congestion, headaches and seizures.    Past Medical History:  Diagnosis Date  . Mono exposure     Patient Active Problem List   Diagnosis Date Noted  . Encounter for insertion of ParaGard IUD 02/15/2017    Past Surgical History:  Procedure Laterality Date  . WISDOM TOOTH EXTRACTION      OB History    Gravida Para Term Preterm AB Living   0 0 0 0 0 0   SAB TAB Ectopic Multiple Live Births   0 0 0 0 0       Home Medications    Prior to Admission medications   Medication Sig Start Date End Date Taking? Authorizing Provider  acetaminophen (TYLENOL) 500 MG tablet Take 1,000 mg by mouth every 6 (six) hours as needed. For pain and fever    Yes [provider]  PARAGARD INTRAUTERINE COPPER IUD IUD 1 each by Intrauterine route once.   Yes [provider]  metroNIDAZOLE (FLAGYL) 500 MG tablet Take 1 tablet (500 mg total) by mouth 2 (two) times daily. One po bid x 7 days 02/27/17   Bethann BerkshireZammit, Cathrine Krizan, MD  ondansetron (ZOFRAN ODT) 4 MG disintegrating tablet 4mg  ODT q4 hours prn nausea/vomit 02/27/17   Bethann BerkshireZammit, Kinsler Soeder, MD  oseltamivir (TAMIFLU) 75 MG capsule Take 1 capsule (75 mg total) by mouth every 12 (twelve) hours.  02/27/17   Bethann BerkshireZammit, Anneliese Leblond, MD    Family History Family History  Problem Relation Age of Onset  . Diabetes Paternal Grandfather   . Diabetes Paternal Grandmother   . Hypertension Maternal Grandmother   . Hypertension Maternal Grandfather     Social History Social History   Tobacco Use  . Smoking status: Never Smoker  . Smokeless tobacco: Never Used  Substance Use Topics  . Alcohol use: Yes    Comment: ocassional  . Drug use: No     Allergies   Food   Review of Systems Review of Systems  Constitutional: Positive for fever. Negative for appetite change and fatigue.  HENT: Negative for congestion, ear discharge and sinus pressure.   Eyes: Negative for discharge.  Respiratory: Negative for cough.   Cardiovascular: Positive for syncope. Negative for chest pain.  Gastrointestinal: Positive for nausea. Negative for abdominal pain and diarrhea.  Genitourinary: Negative for frequency and hematuria.  Musculoskeletal: Positive for myalgias. Negative for back pain.  Skin: Negative for rash.  Neurological: Negative for seizures and headaches.  Psychiatric/Behavioral: Negative for hallucinations.     Physical Exam Updated Vital Signs BP 111/69   Pulse 88   Temp 99.8 F (37.7 C) (Oral)   Resp 16   Ht 5\' 2"  (1.575 m)   Wt 68 kg (150  lb)   LMP 02/14/2017   SpO2 100%   BMI 27.44 kg/m   Physical Exam  Constitutional: She is oriented to person, place, and time. She appears well-developed.  HENT:  Head: Normocephalic.  Eyes: Conjunctivae and EOM are normal. No scleral icterus.  Neck: Neck supple. No thyromegaly present.  Cardiovascular: Normal rate and regular rhythm. Exam reveals no gallop and no friction rub.  No murmur heard. Pulmonary/Chest: No stridor. She has no wheezes. She has no rales. She exhibits no tenderness.  Abdominal: She exhibits no distension. There is tenderness. There is no rebound.  Minimal left lower quadrant tenderness  Musculoskeletal: Normal  range of motion. She exhibits no edema.  Lymphadenopathy:    She has no cervical adenopathy.  Neurological: She is oriented to person, place, and time. She exhibits normal muscle tone. Coordination normal.  Skin: No rash noted. No erythema.  Psychiatric: She has a normal mood and affect. Her behavior is normal.  Nursing note and vitals reviewed.    ED Treatments / Results  Labs (all labs ordered are listed, but only abnormal results are displayed) Labs Reviewed  WET PREP, GENITAL - Abnormal; Notable for the following components:      Result Value   Clue Cells Wet Prep HPF POC PRESENT (*)    WBC, Wet Prep HPF POC FEW (*)    All other components within normal limits  CBC WITH DIFFERENTIAL/PLATELET - Abnormal; Notable for the following components:   Monocytes Absolute 1.2 (*)    All other components within normal limits  COMPREHENSIVE METABOLIC PANEL - Abnormal; Notable for the following components:   CO2 20 (*)    Glucose, Bld 123 (*)    Total Protein 6.4 (*)    All other components within normal limits  URINALYSIS, ROUTINE W REFLEX MICROSCOPIC - Abnormal; Notable for the following components:   APPearance HAZY (*)    Protein, ur 30 (*)    Leukocytes, UA SMALL (*)    Squamous Epithelial / LPF 6-30 (*)    All other components within normal limits  INFLUENZA PANEL BY PCR (TYPE A & B) - Abnormal; Notable for the following components:   Influenza A By PCR POSITIVE (*)    All other components within normal limits  I-STAT BETA HCG BLOOD, ED ([facility], [facility], [facility] ONLY)  GC/CHLAMYDIA PROBE AMP ([place]) NOT AT [facility]    EKG  EKG Interpretation None       Radiology US Pelvic Complete With Transvaginal  Result Date: 02/27/2017 CLINICAL DATA:  Acute left lower quadrant abdominal pain. EXAM: TRANSABDOMINAL AND TRANSVAGINAL ULTRASOUND OF PELVIS TECHNIQUE: Both transabdominal and transvaginal ultrasound examinations of the pelvis were performed. Transabdominal technique was performed for  global imaging of the pelvis including uterus, ovaries, adnexal regions, and pelvic cul-de-sac. It was necessary to proceed with endovaginal exam following the transabdominal exam to visualize the endometrium and ovaries. COMPARISON:  None FINDINGS: Uterus Measurements: 7.1 x 4.1 x 3.8 cm. No fibroids or other mass visualized. Endometrium Thickness: 1 mm which is within normal limits. Intrauterine device is noted within endometrial canal. Right ovary Measurements: 3.2 x 2.3 x 2.3 cm. Normal appearance/no adnexal mass. Left ovary Not visualized due to overlying bowel gas. Other findings Moderate amount of free fluid is noted. IMPRESSION: Left ovary not visualized due to overlying bowel gas. Intrauterine device is noted. Uterus and right ovary are unremarkable. No adnexal mass is noted. Moderate amount of free fluid is noted in the pelvis which may be physiologic, but  ruptured cyst or other abnormality cannot be excluded. Electronically Signed   By: Lupita Raider, M.D.   On: 02/27/2017 12:57    Procedures Procedures (including critical care time)  Medications Ordered in ED Medications  acetaminophen (TYLENOL) tablet 650 mg (not administered)  oseltamivir (TAMIFLU) capsule 75 mg (not administered)  sodium chloride 0.9 % bolus 1,000 mL (0 mLs Intravenous Stopped 02/27/17 1144)  sodium chloride 0.9 % bolus 1,000 mL (0 mLs Intravenous Stopped 02/27/17 1309)  sodium chloride 0.9 % bolus 2,000 mL (2,000 mLs Intravenous New Bag/Given 02/27/17 1300)     Initial Impression / Assessment and Plan / ED Course  I have reviewed the triage vital signs and the nursing notes.  Pertinent labs & imaging results that were available during my care of the patient were reviewed by me and considered in my medical decision making (see chart for details).     Patient with dehydration and influenza.  She feels much better with IV fluids.  She will be discharged home with Tamiflu Zofran and Flagyl for BV.  She will  follow-up with her doctor pain problems  Final Clinical Impressions(s) / ED Diagnoses   Final diagnoses:  Syncope and collapse  Dehydration    ED Discharge Orders        Ordered    oseltamivir (TAMIFLU) 75 MG capsule  Every 12 hours     02/27/17 1445    metroNIDAZOLE (FLAGYL) 500 MG tablet  2 times daily     02/27/17 1445    ondansetron (ZOFRAN ODT) 4 MG disintegrating tablet     02/27/17 1445       Bethann Berkshire, MD 02/27/17 1452

## 2017-02-27 NOTE — Discharge Instructions (Signed)
Drink plenty of fluids take Tylenol or Motrin for aches and fever and rest

## 2017-02-27 NOTE — ED Notes (Signed)
PT also reports first time IUD placement on 02/15/17 and concern due to nausea and lower abdominal cramping.

## 2017-02-27 NOTE — ED Triage Notes (Signed)
Patient had syncopal episode lasting approx 5 minutes per family. Patient states started having a fever of 101.8 yesterday with productive cough-thick, clear sputum. Per patient nausea but no vomiting, diarrhea, or urinary symptoms. Family denies patient hitting her head.

## 2017-02-28 LAB — GC/CHLAMYDIA PROBE AMP (~~LOC~~) NOT AT ARMC
CHLAMYDIA, DNA PROBE: NEGATIVE
NEISSERIA GONORRHEA: NEGATIVE

## 2017-03-15 ENCOUNTER — Ambulatory Visit (INDEPENDENT_AMBULATORY_CARE_PROVIDER_SITE_OTHER): Payer: Managed Care, Other (non HMO) | Admitting: Women's Health

## 2017-03-15 ENCOUNTER — Encounter: Payer: Self-pay | Admitting: Women's Health

## 2017-03-15 VITALS — BP 104/58 | HR 82 | Ht 62.0 in | Wt 146.0 lb

## 2017-03-15 DIAGNOSIS — Z30431 Encounter for routine checking of intrauterine contraceptive device: Secondary | ICD-10-CM

## 2017-03-15 NOTE — Progress Notes (Signed)
   GYN VISIT Patient name: Kristine Lawrence MRN [id number]  Date of birth: 04/02/1995 Chief Complaint:   IUD Check  History of Present Illness:   Kristine Lawrence is a 22 y.o. G0P0000 African American female being seen today for IUD check. She had a Paragard IUD inserted 02/15/17. She went to ED 02/27/17 w/ report of syncopal episode, lower abd pain, fever, nausea, body aches. She tested + for influenza a and BV and was treated for both. She had a pelvic u/s which revealed IUD in correct placement w/in endometrium. She reports all symptoms have resolved. She is able to feel her strings. No dyspareunia. Just got off period, was a little heavier than normal, but not bad.    Patient's last menstrual period was 02/14/2017. The current method of family planning is IUD. Last pap 02/12/15. Results were:  normal Review of Systems:   Pertinent items are noted in HPI Denies fever/chills, dizziness, headaches, visual disturbances, fatigue, shortness of breath, chest pain, abdominal pain, vomiting, abnormal vaginal discharge/itching/odor/irritation, problems with periods, bowel movements, urination, or intercourse unless otherwise stated above.  Pertinent History Reviewed:  Reviewed past medical,surgical, social, obstetrical and family history.  Reviewed problem list, medications and allergies. Physical Assessment:   Vitals:   03/15/17 0859  BP: (!) 104/58  Pulse: 82  Weight: 146 lb (66.2 kg)  Height: 5\' 2"  (1.575 m)  Body mass index is 26.7 kg/m.       Physical Examination:   General appearance: alert, well appearing, and in no distress  Mental status: alert, oriented to person, place, and time  Skin: warm & dry   Cardiovascular: normal heart rate noted  Respiratory: normal respiratory effort, no distress  Abdomen: soft, non-tender   Pelvic: VULVA: normal appearing vulva with no masses, tenderness or lesions, VAGINA: normal appearing vagina with normal color and discharge, no lesions, CERVIX: normal  appearing cervix without discharge or lesions, IUD strings visible, tucked behind cx  Extremities: no edema   No results found for this or any previous visit (from the past 24 hour(s)).  Assessment & Plan:  1) IUD check> Paragard IUD in place, check strings monthly  Meds: No orders of the defined types were placed in this encounter.   No orders of the defined types were placed in this encounter.   Return for Dec for , Pap & physical.  Marge DuncansBooker, Kimberly Randall CNM, [facility] 03/15/2017 9:16 AM

## 2018-03-12 ENCOUNTER — Other Ambulatory Visit: Payer: Self-pay | Admitting: Family Medicine

## 2018-10-08 ENCOUNTER — Encounter: Payer: Self-pay | Admitting: Family Medicine

## 2018-10-08 ENCOUNTER — Other Ambulatory Visit: Payer: Self-pay

## 2018-10-08 ENCOUNTER — Ambulatory Visit (INDEPENDENT_AMBULATORY_CARE_PROVIDER_SITE_OTHER): Payer: No Typology Code available for payment source | Admitting: Family Medicine

## 2018-10-08 VITALS — BP 112/80 | Temp 97.0°F | Ht 60.75 in | Wt 135.8 lb

## 2018-10-08 DIAGNOSIS — Z0000 Encounter for general adult medical examination without abnormal findings: Secondary | ICD-10-CM | POA: Diagnosis not present

## 2018-10-08 DIAGNOSIS — Z79899 Other long term (current) drug therapy: Secondary | ICD-10-CM | POA: Diagnosis not present

## 2018-10-08 NOTE — Progress Notes (Signed)
   Subjective:    Patient ID: Kristine Lawrence, female    DOB: 1995/11/28, 23 y.o.   MRN: [id number]  HPI The patient comes in today for a wellness visit.    A review of their health history was completed.  A review of medications was also completed.  Any needed refills; none  Eating habits: healthy eating habits  Falls/  MVA accidents in past few months: none  Regular exercise: 5 days per week; weight training and running. Pt has home workout area at home  Specialist pt sees on regular basis: [facility]  Preventative health issues were discussed.   Additional concerns: none  Flu shot thi yr  At the flu clinic    Five ties per week    Excellent diet eats very well watching eveythong       Doing nore at hoe    Review of Systems  Constitutional: Negative for activity change, appetite change and fatigue.  HENT: Negative for congestion and rhinorrhea.   Eyes: Negative for discharge.  Respiratory: Negative for cough, chest tightness and wheezing.   Cardiovascular: Negative for chest pain.  Gastrointestinal: Negative for abdominal pain, blood in stool and vomiting.  Endocrine: Negative for polyphagia.  Genitourinary: Negative for difficulty urinating and frequency.  Musculoskeletal: Negative for neck pain.  Skin: Negative for color change.  Allergic/Immunologic: Negative for environmental allergies and food allergies.  Neurological: Negative for weakness and headaches.  Psychiatric/Behavioral: Negative for agitation and behavioral problems.  All other systems reviewed and are negative.      Objective:   Physical Exam Vitals signs reviewed.  Constitutional:      Appearance: She is well-developed.  HENT:     Head: Normocephalic.     Right Ear: External ear normal.     Left Ear: External ear normal.  Eyes:     Pupils: Pupils are equal, round, and reactive to light.  Neck:     Musculoskeletal: Normal range of motion.     Thyroid: No thyromegaly.   Cardiovascular:     Rate and Rhythm: Normal rate and regular rhythm.     Heart sounds: Normal heart sounds. No murmur.  Pulmonary:     Effort: Pulmonary effort is normal. No respiratory distress.     Breath sounds: Normal breath sounds. No wheezing.  Abdominal:     General: Bowel sounds are normal. There is no distension.     Palpations: Abdomen is soft. There is no mass.     Tenderness: There is no abdominal tenderness.  Musculoskeletal: Normal range of motion.        General: No tenderness.  Lymphadenopathy:     Cervical: No cervical adenopathy.  Skin:    General: Skin is warm and dry.  Neurological:     Mental Status: She is alert and oriented to person, place, and time.     Motor: No abnormal muscle tone.  Psychiatric:        Behavior: Behavior normal.           Assessment & Plan:  Impression well adult exam.  Gets GYN preventive services elsewhere.  Exercises regularly.  Excellent diet.  Doing great at work as Therapist, sports in the emergency room.  Screening blood work discussed and recommended.  General concerns discussed

## 2019-01-21 ENCOUNTER — Other Ambulatory Visit: Payer: No Typology Code available for payment source

## 2019-01-28 ENCOUNTER — Other Ambulatory Visit (INDEPENDENT_AMBULATORY_CARE_PROVIDER_SITE_OTHER): Payer: No Typology Code available for payment source

## 2019-01-28 ENCOUNTER — Other Ambulatory Visit: Payer: Self-pay

## 2019-01-28 DIAGNOSIS — Z111 Encounter for screening for respiratory tuberculosis: Secondary | ICD-10-CM | POA: Diagnosis not present

## 2019-01-28 DIAGNOSIS — Z23 Encounter for immunization: Secondary | ICD-10-CM | POA: Diagnosis not present

## 2019-01-30 LAB — TB SKIN TEST
Induration: 0 mm
TB Skin Test: NEGATIVE

## 2019-02-28 ENCOUNTER — Telehealth: Payer: Self-pay | Admitting: Family Medicine

## 2019-02-28 NOTE — Telephone Encounter (Signed)
Form dropped off by pt for Dr. Brett Canales to sign off on. Form placed in providers box.

## 2019-03-12 ENCOUNTER — Encounter: Payer: Self-pay | Admitting: Family Medicine

## 2019-03-13 ENCOUNTER — Encounter: Payer: Self-pay | Admitting: Family Medicine

## 2019-05-29 ENCOUNTER — Other Ambulatory Visit: Payer: Managed Care, Other (non HMO) | Admitting: Women's Health

## 2019-06-10 ENCOUNTER — Other Ambulatory Visit: Payer: Self-pay

## 2019-06-10 ENCOUNTER — Ambulatory Visit (INDEPENDENT_AMBULATORY_CARE_PROVIDER_SITE_OTHER): Payer: No Typology Code available for payment source | Admitting: Women's Health

## 2019-06-10 ENCOUNTER — Other Ambulatory Visit (HOSPITAL_COMMUNITY)
Admission: RE | Admit: 2019-06-10 | Discharge: 2019-06-10 | Disposition: A | Discharge status: Home / Self Care | Payer: No Typology Code available for payment source | Source: Ambulatory Visit | Attending: Obstetrics & Gynecology | Admitting: Obstetrics & Gynecology

## 2019-06-10 ENCOUNTER — Encounter: Payer: Self-pay | Admitting: Women's Health

## 2019-06-10 VITALS — Ht 62.0 in | Wt 143.8 lb

## 2019-06-10 DIAGNOSIS — Z803 Family history of malignant neoplasm of breast: Secondary | ICD-10-CM | POA: Diagnosis not present

## 2019-06-10 DIAGNOSIS — Z01419 Encounter for gynecological examination (general) (routine) without abnormal findings: Secondary | ICD-10-CM | POA: Insufficient documentation

## 2019-06-10 NOTE — Progress Notes (Signed)
Pap & Breast exam only  Patient name: Kristine Lawrence MRN [id number]  Date of birth: [birth year] Chief Complaint:   Gynecologic Exam  History of Present Illness:   Kristine Lawrence is a 24 y.o. [place] female being seen today for a routine pap & breast exam only (physical w/ PCP). Got married last Fall! Current complaints: none  Depression screen [facility] 2/9 06/10/2019 03/08/2016  Decreased Interest 0 0  Down, Depressed, Hopeless 0 0  PHQ - 2 Score 0 0  Altered sleeping 0 -  Tired, decreased energy 1 -  Change in appetite 0 -  Feeling bad or failure about yourself  0 -  Trouble concentrating 0 -  Moving slowly or fidgety/restless 0 -  Suicidal thoughts 0 -  PHQ-9 Score 1 -  Difficult doing work/chores Not difficult at all -     PCP: [facility]      does not desire labs No LMP recorded. (Menstrual status: IUD). The current method of family planning is IUD. Paragard inserted 02/15/17, has regular normal flow periods, no problems Last pap 02/12/15. Results were: normal. H/O abnormal pap: no Last mammogram: never. Results were: N/A. Family h/o breast cancer: yes 87yo 1/2 sister (same father) just dx w/ breast cancer. Pt did 23&Me testing and was BRCA neg Last colonoscopy: never. Results were: N/A. Family h/o colorectal cancer: no Review of Systems:   Pertinent items are noted in HPI Denies any headaches, blurred vision, fatigue, shortness of breath, chest pain, abdominal pain, abnormal vaginal discharge/itching/odor/irritation, problems with periods, bowel movements, urination, or intercourse unless otherwise stated above. Pertinent History Reviewed:  Reviewed past medical,surgical, social and family history.  Reviewed problem list, medications and allergies. Physical Assessment:   Vitals:   06/10/19 1059  Weight: 143 lb 12.8 oz (65.2 kg)  Height: '5\' 2"'  (1.575 m)  Body mass index is 26.3 kg/m.        Physical Examination:   General appearance - well appearing, and in  no distress  Mental status - alert, oriented to person, place, and time  Psych:  She has a normal mood and affect  Skin - warm and dry, normal color, no suspicious lesions noted  Chest - effort normal, all lung fields clear to auscultation bilaterally  Heart - normal rate and regular rhythm  Neck:  midline trachea, no thyromegaly or nodules  Breasts - breasts appear normal, no suspicious masses, no skin or nipple changes or  axillary nodes  Abdomen - soft, nontender, nondistended, no masses or organomegaly  Pelvic - VULVA: normal appearing vulva with no masses, tenderness or lesions  VAGINA: normal appearing vagina with normal color and discharge, no lesions  CERVIX: normal appearing cervix without discharge or lesions, no CMT, IUD strings visible, appropriate length  Thin prep pap is done w/ HR HPV cotesting  UTERUS: uterus is felt to be normal size, shape, consistency and nontender   ADNEXA: No adnexal masses or tenderness noted.  Extremities:  No swelling or varicosities noted  Chaperone: [place]    No results found for this or any previous visit (from the past 24 hour(s)).  Assessment & Plan:  1) Pap & Breast Exam only  2) Family h/o breast cancer>31yo 1/2 sister just dx, pt BRCA neg- will research to see if she needs to start screening earlier  Labs/procedures today: pap  Mammogram or sooner if problems Colonoscopy '@26yo'  or sooner if problems  No orders of the defined types were placed in this encounter.  Meds: No orders of the defined types were placed in this encounter.   Follow-up: Return in about 1 year (around 06/09/2020) for Physical.  [place], [facility] 06/10/2019 11:43 AM

## 2019-06-11 LAB — CYTOLOGY - PAP
Comment: NEGATIVE
Diagnosis: NEGATIVE
High risk HPV: NEGATIVE

## 2019-06-19 ENCOUNTER — Other Ambulatory Visit: Payer: Self-pay | Admitting: Family Medicine

## 2019-08-21 ENCOUNTER — Telehealth: Payer: Self-pay | Admitting: Family Medicine

## 2019-08-21 DIAGNOSIS — Z131 Encounter for screening for diabetes mellitus: Secondary | ICD-10-CM

## 2019-08-21 DIAGNOSIS — Z130 Encounter for screening for diseases of the blood and blood-forming organs and certain disorders involving the immune mechanism: Secondary | ICD-10-CM

## 2019-08-21 DIAGNOSIS — Z13220 Encounter for screening for lipoid disorders: Secondary | ICD-10-CM

## 2019-08-21 NOTE — Telephone Encounter (Signed)
Pt has CPE on 7/27 and would like to know if lab work is needed.

## 2019-08-21 NOTE — Telephone Encounter (Signed)
Last labs 10/08/19 : Lipid, Liver and Met 7

## 2019-08-26 NOTE — Telephone Encounter (Signed)
Pls order cbc, cmp, lipids thx. D.r Zamari Bonsall

## 2019-08-27 NOTE — Telephone Encounter (Signed)
Blood work ordered in Epic. Patient notified. 

## 2019-09-03 LAB — COMPREHENSIVE METABOLIC PANEL
ALT: 22 IU/L (ref 0–32)
AST: 17 IU/L (ref 0–40)
Albumin/Globulin Ratio: 1.7 (ref 1.2–2.2)
Albumin: 4.2 g/dL (ref 3.9–5.0)
Alkaline Phosphatase: 52 IU/L (ref 48–121)
BUN/Creatinine Ratio: 17 (ref 9–23)
BUN: 16 mg/dL (ref 6–20)
Bilirubin Total: 0.5 mg/dL (ref 0.0–1.2)
CO2: 23 mmol/L (ref 20–29)
Calcium: 9.4 mg/dL (ref 8.7–10.2)
Chloride: 104 mmol/L (ref 96–106)
Creatinine, Ser: 0.93 mg/dL (ref 0.57–1.00)
GFR calc Af Amer: 99 mL/min/{1.73_m2} (ref >59)
GFR calc non Af Amer: 86 mL/min/{1.73_m2} (ref >59)
Globulin, Total: 2.5 g/dL (ref 1.5–4.5)
Glucose: 83 mg/dL (ref 65–99)
Potassium: 4.1 mmol/L (ref 3.5–5.2)
Sodium: 139 mmol/L (ref 134–144)
Total Protein: 6.7 g/dL (ref 6.0–8.5)

## 2019-09-03 LAB — CBC WITH DIFFERENTIAL/PLATELET
Basophils Absolute: 0 10*3/uL (ref 0.0–0.2)
Basos: 1 %
EOS (ABSOLUTE): 0.1 10*3/uL (ref 0.0–0.4)
Eos: 1 %
Hematocrit: 40.8 % (ref 34.0–46.6)
Hemoglobin: 13.6 g/dL (ref 11.1–15.9)
Immature Grans (Abs): 0 10*3/uL (ref 0.0–0.1)
Immature Granulocytes: 0 %
Lymphocytes Absolute: 2.1 10*3/uL (ref 0.7–3.1)
Lymphs: 44 %
MCH: 30.7 pg (ref 26.6–33.0)
MCHC: 33.3 g/dL (ref 31.5–35.7)
MCV: 92 fL (ref 79–97)
Monocytes Absolute: 0.3 10*3/uL (ref 0.1–0.9)
Monocytes: 7 %
Neutrophils Absolute: 2.4 10*3/uL (ref 1.4–7.0)
Neutrophils: 47 %
Platelets: 227 10*3/uL (ref 150–450)
RBC: 4.43 x10E6/uL (ref 3.77–5.28)
RDW: 12.4 % (ref 11.7–15.4)
WBC: 4.9 10*3/uL (ref 3.4–10.8)

## 2019-09-03 LAB — LIPID PANEL
Chol/HDL Ratio: 2.2 ratio (ref 0.0–4.4)
Cholesterol, Total: 188 mg/dL (ref 100–199)
HDL: 84 mg/dL (ref >39)
LDL Chol Calc (NIH): 96 mg/dL (ref 0–99)
Triglycerides: 37 mg/dL (ref 0–149)
VLDL Cholesterol Cal: 8 mg/dL (ref 5–40)

## 2019-09-09 ENCOUNTER — Ambulatory Visit (INDEPENDENT_AMBULATORY_CARE_PROVIDER_SITE_OTHER): Payer: No Typology Code available for payment source | Admitting: Family Medicine

## 2019-09-09 ENCOUNTER — Other Ambulatory Visit: Payer: Self-pay

## 2019-09-09 ENCOUNTER — Encounter: Payer: Self-pay | Admitting: Family Medicine

## 2019-09-09 VITALS — BP 116/72 | HR 70 | Temp 97.7°F | Ht 60.5 in | Wt 144.2 lb

## 2019-09-09 DIAGNOSIS — Z0000 Encounter for general adult medical examination without abnormal findings: Secondary | ICD-10-CM

## 2019-09-09 NOTE — Progress Notes (Signed)
Patient ID: Kristine Lawrence, female    DOB: 05-Apr-1995, 24 y.o.   MRN: [id number]   Chief Complaint  Patient presents with  . Annual Exam   Subjective:    HPI   The patient comes in today for a wellness visit. Pt is an NP Insurance underwriter with IM office, Dr. Margo Aye.   A review of their health history was completed.  A review of medications was also completed.  Any needed refills; none  Eating habits: eats healthy  Falls/  MVA accidents in past few months: none  Regular exercise: run 3-4 miles three times a week and weight training three times a week  Specialist pt sees on regular basis: gyn for well woman exams.  Preventative health issues were discussed.   Additional concerns: none   Medical History Kristine Lawrence has a past medical history of Mono exposure.   Outpatient Encounter Medications as of 09/09/2019  Medication Sig  . acetaminophen (TYLENOL) 500 MG tablet Take 1,000 mg by mouth every 6 (six) hours as needed. For pain and fever   . PARAGARD INTRAUTERINE COPPER IUD IUD 1 each by Intrauterine route once.  . [DISCONTINUED] triamcinolone cream (KENALOG) 0.1 % Apply to affected area BID prn up to 2 weeks at a time   No facility-administered encounter medications on file as of 09/09/2019.     Review of Systems  Constitutional: Negative for chills, fever and unexpected weight change.  HENT: Negative for congestion and ear pain.   Eyes: Negative.   Respiratory: Negative.   Cardiovascular: Negative for chest pain, palpitations and leg swelling.  Gastrointestinal: Positive for abdominal pain and diarrhea. Negative for blood in stool.  Endocrine: Negative.   Genitourinary: Negative.   Musculoskeletal: Negative.   Skin: Negative.   Allergic/Immunologic: Negative.   Neurological: Negative.   Hematological: Negative.   Psychiatric/Behavioral: Negative.    Associates abdominal pain and occ diarrhea with food choices: red meat, etc. Sensitive stomach. Avoids  triggers.  Vitals BP 116/72   Pulse 70   Temp 97.7 F (36.5 C)   Ht 5' 0.5" (1.537 m)   Wt 144 lb 3.2 oz (65.4 kg)   SpO2 98%   BMI 27.70 kg/m   Objective:   Physical Exam Vitals and nursing note reviewed.  Constitutional:      General: She is not in acute distress.    Appearance: Normal appearance. She is not ill-appearing.  HENT:     Head: Normocephalic and atraumatic.     Right Ear: Tympanic membrane, ear canal and external ear normal.     Left Ear: Tympanic membrane, ear canal and external ear normal.     Nose: Nose normal.     Mouth/Throat:     Mouth: Mucous membranes are moist.     Pharynx: Oropharynx is clear.  Eyes:     Extraocular Movements: Extraocular movements intact.     Conjunctiva/sclera: Conjunctivae normal.     Pupils: Pupils are equal, round, and reactive to light.  Cardiovascular:     Rate and Rhythm: Normal rate and regular rhythm.     Pulses: Normal pulses.     Heart sounds: Normal heart sounds. No murmur heard.   Pulmonary:     Effort: Pulmonary effort is normal.     Breath sounds: Normal breath sounds. No wheezing, rhonchi or rales.  Abdominal:     General: Abdomen is flat. Bowel sounds are normal. There is no distension.     Palpations: Abdomen is soft. There is  no mass.     Tenderness: There is no abdominal tenderness. There is no guarding or rebound.     Hernia: No hernia is present.  Musculoskeletal:        General: Normal range of motion.     Cervical back: Normal range of motion.     Right lower leg: No edema.     Left lower leg: No edema.  Skin:    General: Skin is warm and dry.     Findings: No lesion or rash.  Neurological:     General: No focal deficit present.     Mental Status: She is alert and oriented to person, place, and time.     Cranial Nerves: No cranial nerve deficit.  Psychiatric:        Mood and Affect: Mood normal.        Behavior: Behavior normal.        Thought Content: Thought content normal.         Judgment: Judgment normal.     Assessment and Plan   1. Well adult exam   Doing well and normal exam. Labs reviewed, normal overall.   F/u 1 yr or prn.

## 2019-09-09 NOTE — Patient Instructions (Signed)
Healthy Eating Following a healthy eating pattern may help you to achieve and maintain a healthy body weight, reduce the risk of chronic disease, and live a long and productive life. It is important to follow a healthy eating pattern at an appropriate calorie level for your body. Your nutritional needs should be met primarily through food by choosing a variety of nutrient-rich foods. What are tips for following this plan? Reading food labels  Read labels and choose the following: ? Reduced or low sodium. ? Juices with 100% fruit juice. ? Foods with low saturated fats and high polyunsaturated and monounsaturated fats. ? Foods with whole grains, such as whole wheat, cracked wheat, brown rice, and wild rice. ? Whole grains that are fortified with folic acid. This is recommended for women who are pregnant or who want to become pregnant.  Read labels and avoid the following: ? Foods with a lot of added sugars. These include foods that contain brown sugar, corn sweetener, corn syrup, dextrose, fructose, glucose, high-fructose corn syrup, honey, invert sugar, lactose, malt syrup, maltose, molasses, raw sugar, sucrose, trehalose, or turbinado sugar.  Do not eat more than the following amounts of added sugar per day:  6 teaspoons (25 g) for women.  9 teaspoons (38 g) for men. ? Foods that contain processed or refined starches and grains. ? Refined grain products, such as white flour, degermed cornmeal, white bread, and white rice. Shopping  Choose nutrient-rich snacks, such as vegetables, whole fruits, and nuts. Avoid high-calorie and high-sugar snacks, such as potato chips, fruit snacks, and candy.  Use oil-based dressings and spreads on foods instead of solid fats such as butter, stick margarine, or cream cheese.  Limit pre-made sauces, mixes, and "instant" products such as flavored rice, instant noodles, and ready-made pasta.  Try more plant-protein sources, such as tofu, tempeh, black beans,  edamame, lentils, nuts, and seeds.  Explore eating plans such as the Mediterranean diet or vegetarian diet. Cooking  Use oil to saut or stir-fry foods instead of solid fats such as butter, stick margarine, or lard.  Try baking, boiling, grilling, or broiling instead of frying.  Remove the fatty part of meats before cooking.  Steam vegetables in water or broth. Meal planning   At meals, imagine dividing your plate into fourths: ? One-half of your plate is fruits and vegetables. ? One-fourth of your plate is whole grains. ? One-fourth of your plate is protein, especially lean meats, poultry, eggs, tofu, beans, or nuts.  Include low-fat dairy as part of your daily diet. Lifestyle  Choose healthy options in all settings, including home, work, school, restaurants, or stores.  Prepare your food safely: ? Wash your hands after handling raw meats. ? Keep food preparation surfaces clean by regularly washing with hot, soapy water. ? Keep raw meats separate from ready-to-eat foods, such as fruits and vegetables. ? Cook seafood, meat, poultry, and eggs to the recommended internal temperature. ? Store foods at safe temperatures. In general:  Keep cold foods at 59F (4.4C) or below.  Keep hot foods at 159F (60C) or above.  Keep your freezer at [facility] (-17.8C) or below.  Foods are no longer safe to eat when they have been between the temperatures of 40-159F (4.4-60C) for more than 2 hours. What foods should I eat? Fruits Aim to eat 2 cup-equivalents of fresh, canned (in natural juice), or frozen fruits each day. Examples of 1 cup-equivalent of fruit include 1 small apple, 8 large strawberries, 1 cup canned fruit,  cup  dried fruit, or 1 cup 100% juice. Vegetables Aim to eat 2-3 cup-equivalents of fresh and frozen vegetables each day, including different varieties and colors. Examples of 1 cup-equivalent of vegetables include 2 medium carrots, 2 cups raw, leafy greens, 1 cup chopped  vegetable (raw or cooked), or 1 medium baked potato. Grains Aim to eat 6 ounce-equivalents of whole grains each day. Examples of 1 ounce-equivalent of grains include 1 slice of bread, 1 cup ready-to-eat cereal, 3 cups popcorn, or  cup cooked rice, pasta, or cereal. Meats and other proteins Aim to eat 5-6 ounce-equivalents of protein each day. Examples of 1 ounce-equivalent of protein include 1 egg, 1/2 cup nuts or seeds, or 1 tablespoon (16 g) peanut butter. A cut of meat or fish that is the size of a deck of cards is about 3-4 ounce-equivalents.  Of the protein you eat each week, try to have at least 8 ounces come from seafood. This includes salmon, trout, herring, and anchovies. Dairy Aim to eat 3 cup-equivalents of fat-free or low-fat dairy each day. Examples of 1 cup-equivalent of dairy include 1 cup (240 mL) milk, 8 ounces (250 g) yogurt, 1 ounces (44 g) natural cheese, or 1 cup (240 mL) fortified soy milk. Fats and oils  Aim for about 5 teaspoons (21 g) per day. Choose monounsaturated fats, such as canola and olive oils, avocados, peanut butter, and most nuts, or polyunsaturated fats, such as sunflower, corn, and soybean oils, walnuts, pine nuts, sesame seeds, sunflower seeds, and flaxseed. Beverages  Aim for six 8-oz glasses of water per day. Limit coffee to three to five 8-oz cups per day.  Limit caffeinated beverages that have added calories, such as soda and energy drinks.  Limit alcohol intake to no more than 1 drink a day for nonpregnant women and 2 drinks a day for men. One drink equals 12 oz of beer (355 mL), 5 oz of wine (148 mL), or 1 oz of hard liquor (44 mL). Seasoning and other foods  Avoid adding excess amounts of salt to your foods. Try flavoring foods with herbs and spices instead of salt.  Avoid adding sugar to foods.  Try using oil-based dressings, sauces, and spreads instead of solid fats. This information is based on general [place] nutrition guidelines. For more  information, visit [employer]. Exact amounts may vary based on your nutrition needs. Summary  A healthy eating plan may help you to maintain a healthy weight, reduce the risk of chronic diseases, and stay active throughout your life.  Plan your meals. Make sure you eat the right portions of a variety of nutrient-rich foods.  Try baking, boiling, grilling, or broiling instead of frying.  Choose healthy options in all settings, including home, work, school, restaurants, or stores. This information is not intended to replace advice given to you by your health care provider. Make sure you discuss any questions you have with your health care provider. Document Revised: 05/14/2017 Document Reviewed: 05/14/2017 Elsevier Patient Education  [place].

## 2019-11-13 IMAGING — US US PELVIS COMPLETE TRANSABD/TRANSVAG
1 series · 13 of 25 positions shown · non-contrast
Comparison: None

CLINICAL DATA: Acute left lower quadrant abdominal pain.

EXAM:
TRANSABDOMINAL AND TRANSVAGINAL ULTRASOUND OF PELVIS
TECHNIQUE: Both transabdominal and transvaginal ultrasound examinations of the
pelvis were performed. Transabdominal technique was performed for
global imaging of the pelvis including uterus, ovaries, adnexal
regions, and pelvic cul-de-sac. It was necessary to proceed with
endovaginal exam following the transabdominal exam to visualize the
endometrium and ovaries.

[Series 1: us pelvis complete transabd/transvag · 0.22mm/px · 13 of 84 slices shown]
[im 1/84]
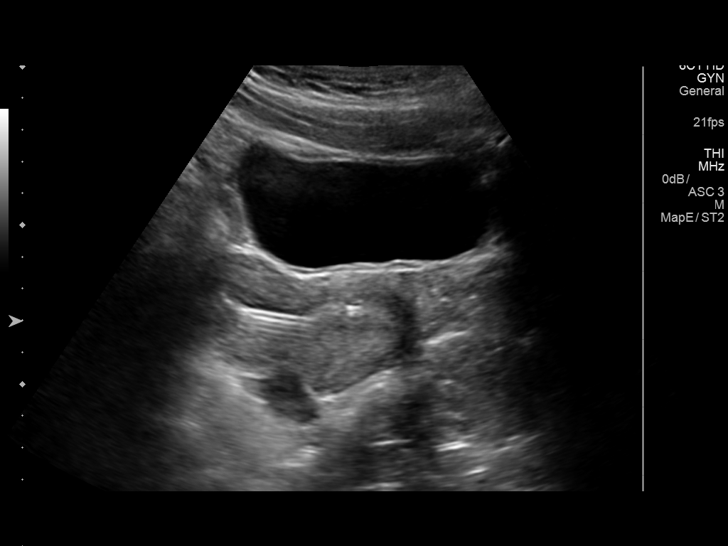
[im 7/84]
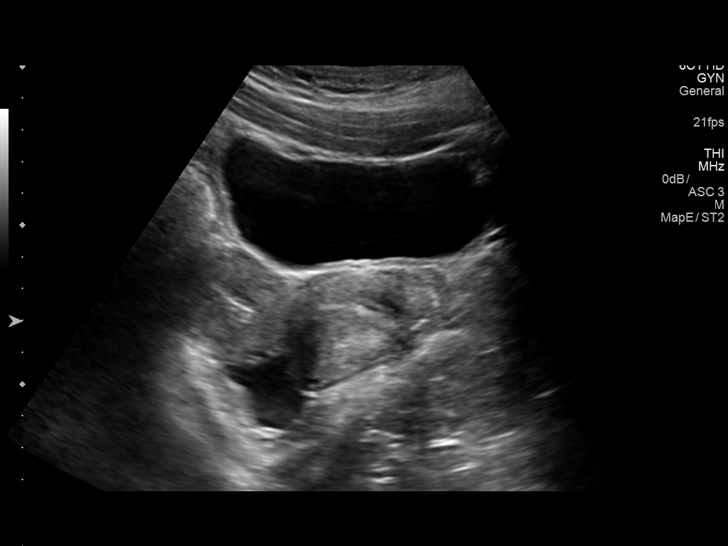
[im 14/84]
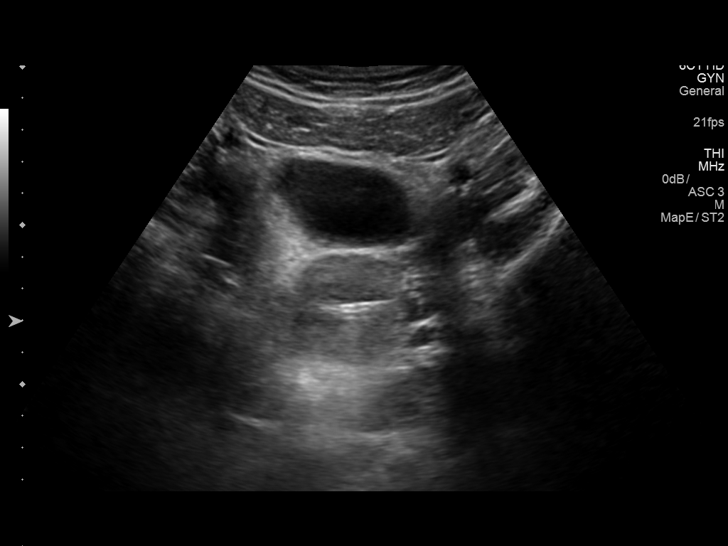
[im 21/84]
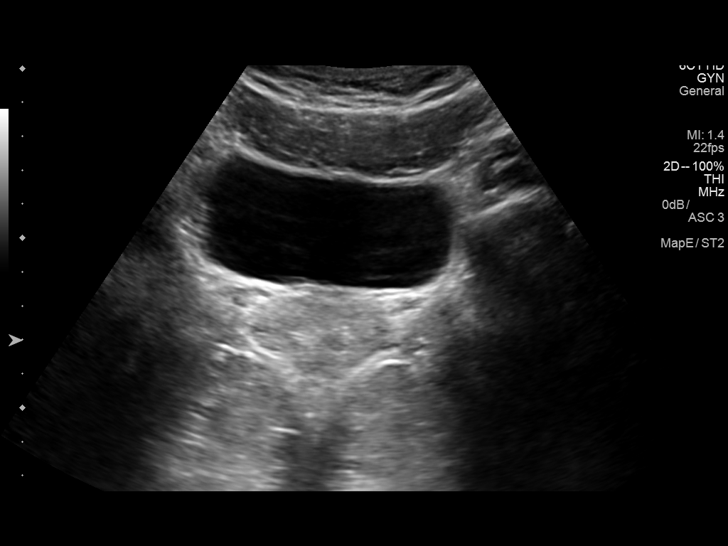
[im 28/84]
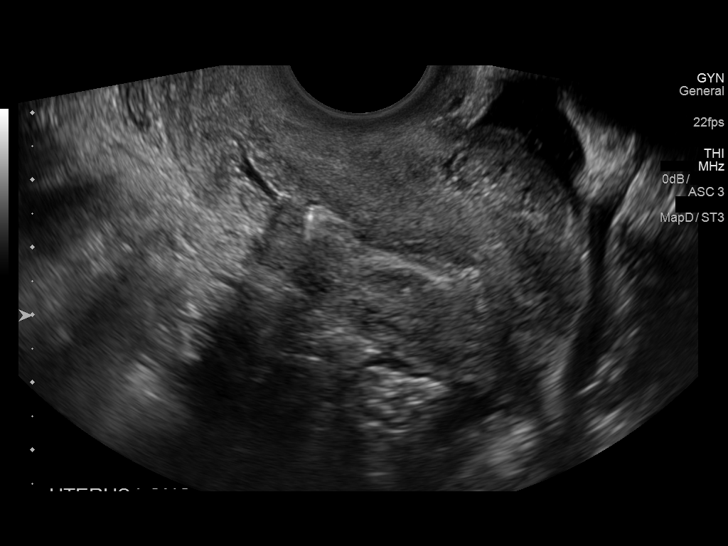
[im 35/84]
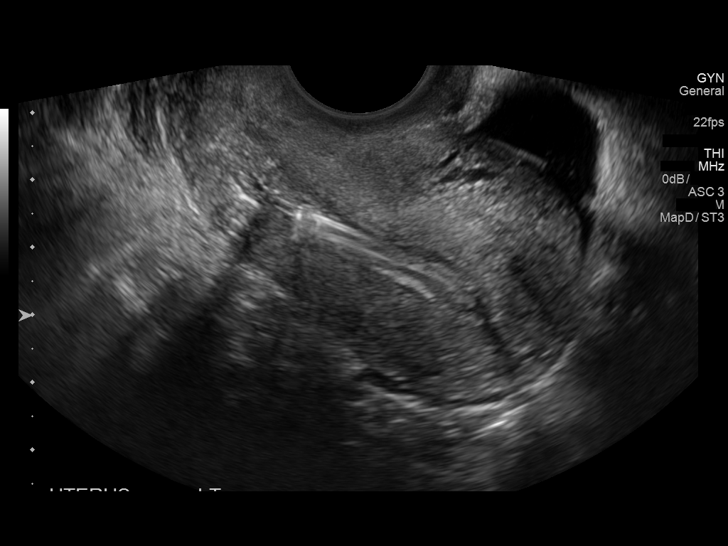
[im 42/84]
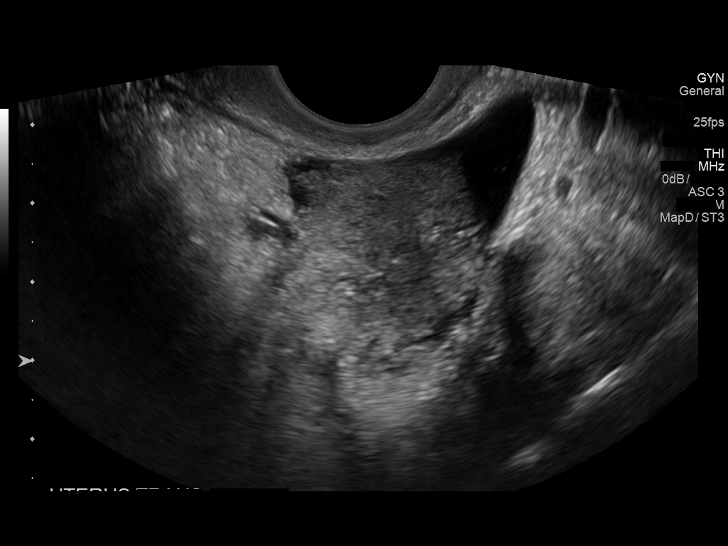
[im 49/84]
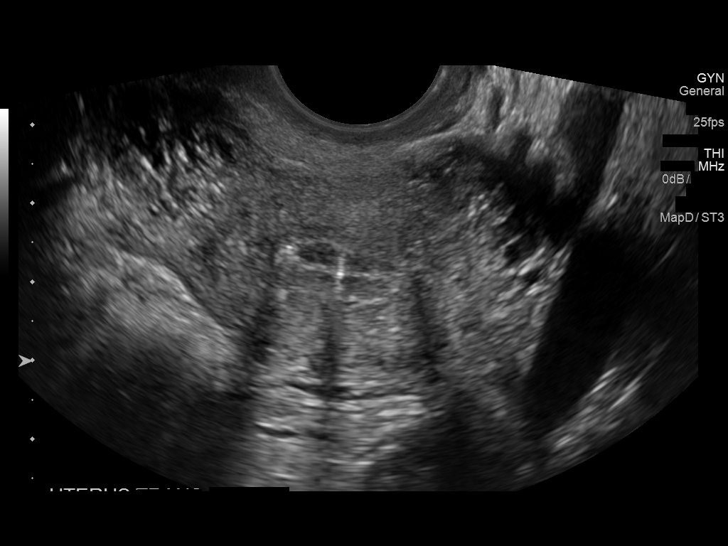
[im 56/84]
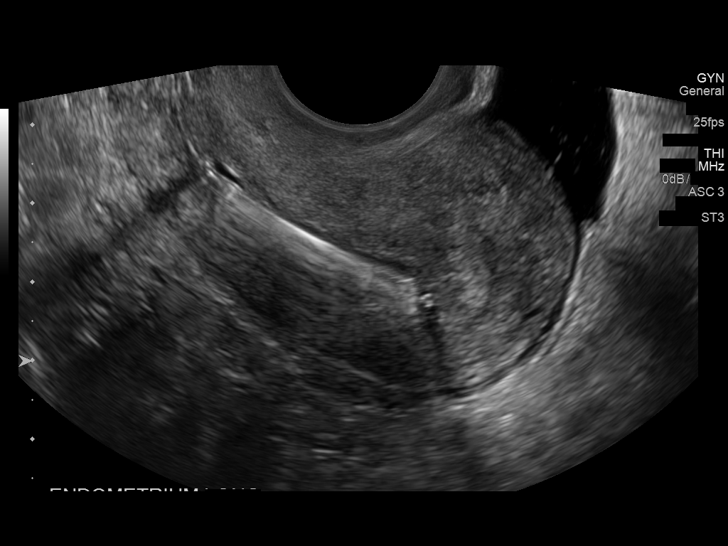
[im 63/84]
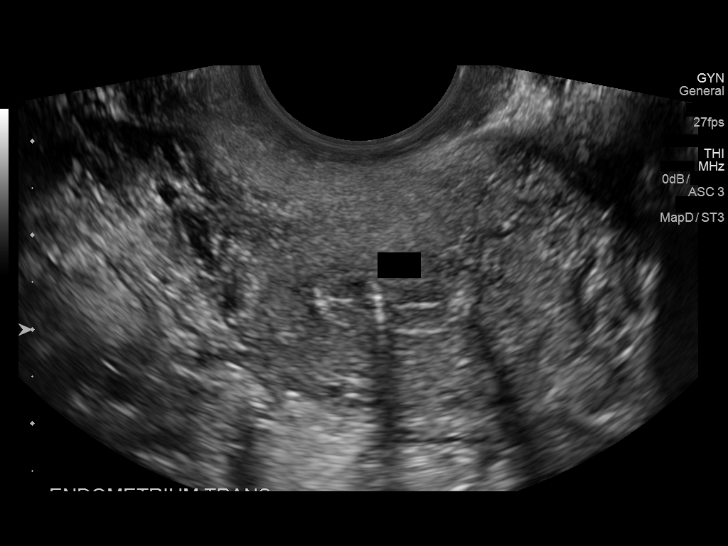
[im 70/84]
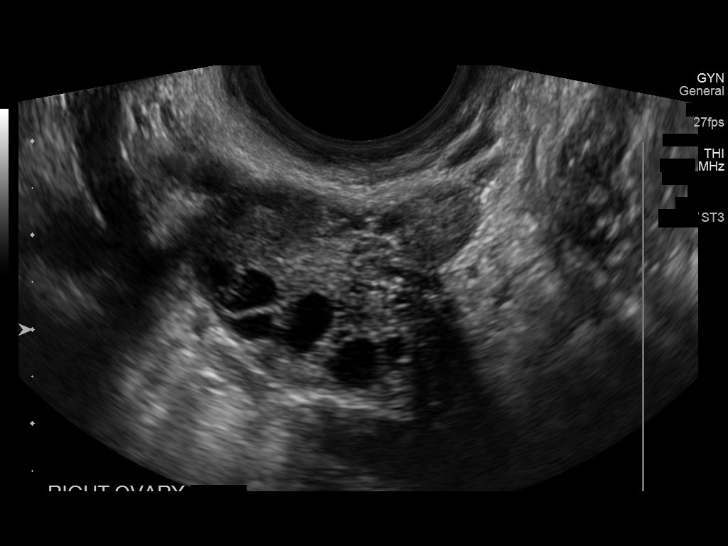
[im 77/84]
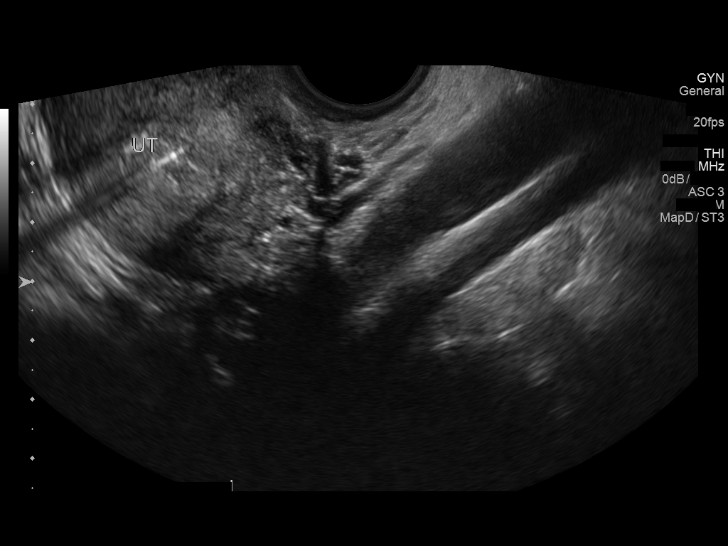
[im 84/84]
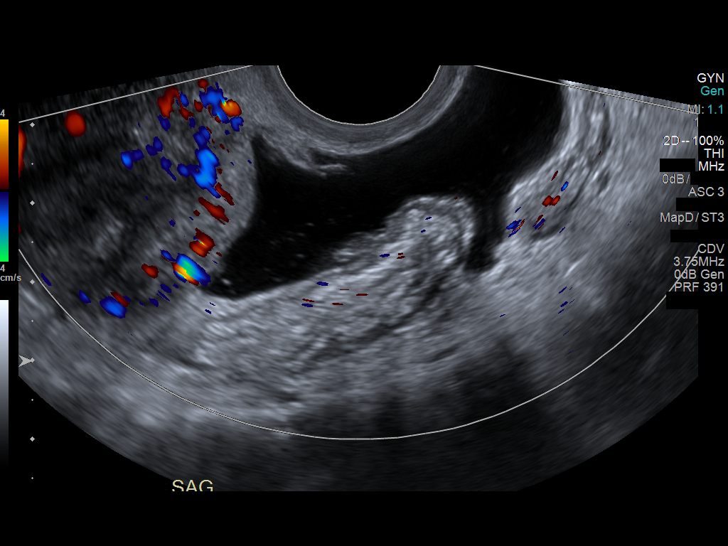

[13 of 25 positions shown; findings below may reference images not displayed]

FINDINGS: Uterus

Measurements: 7.1 x 4.1 x 3.8 cm. No fibroids or other mass
visualized.

Endometrium

Thickness: 1 mm which is within normal limits. Intrauterine device
is noted within endometrial canal.

Right ovary

Measurements: 3.2 x 2.3 x 2.3 cm. Normal appearance/no adnexal mass.

Left ovary

Not visualized due to overlying bowel gas.

Other findings

Moderate amount of free fluid is noted.
IMPRESSION: Left ovary not visualized due to overlying bowel gas.

Intrauterine device is noted.

Uterus and right ovary are unremarkable.

No adnexal mass is noted.

Moderate amount of free fluid is noted in the pelvis which may be
physiologic, but ruptured cyst or other abnormality cannot be
excluded.

## 2020-03-11 ENCOUNTER — Encounter: Payer: Self-pay | Admitting: *Deleted

## 2020-03-11 ENCOUNTER — Telehealth: Payer: Self-pay | Admitting: Women's Health

## 2020-03-11 NOTE — Telephone Encounter (Signed)
Pt had PAP here 2021, & her physcial w/PCP 2021 - please advise on when pt is due for another PAP?  Pt will continue to get her physicals with her PCP  (pt aware Selena Batten is out of the office until Monday)

## 2020-03-11 NOTE — Telephone Encounter (Signed)
Sent [employer]. Next pap due in 05/2022

## 2020-10-22 ENCOUNTER — Other Ambulatory Visit: Payer: Self-pay

## 2020-10-22 ENCOUNTER — Encounter: Payer: Self-pay | Admitting: Adult Health

## 2020-10-22 ENCOUNTER — Ambulatory Visit (INDEPENDENT_AMBULATORY_CARE_PROVIDER_SITE_OTHER): Payer: No Typology Code available for payment source | Admitting: Adult Health

## 2020-10-22 VITALS — BP 129/84 | HR 57 | Ht 61.0 in | Wt 147.0 lb

## 2020-10-22 DIAGNOSIS — Z3202 Encounter for pregnancy test, result negative: Secondary | ICD-10-CM | POA: Insufficient documentation

## 2020-10-22 DIAGNOSIS — Z319 Encounter for procreative management, unspecified: Secondary | ICD-10-CM | POA: Insufficient documentation

## 2020-10-22 DIAGNOSIS — Z30432 Encounter for removal of intrauterine contraceptive device: Secondary | ICD-10-CM | POA: Diagnosis not present

## 2020-10-22 DIAGNOSIS — Z113 Encounter for screening for infections with a predominantly sexual mode of transmission: Secondary | ICD-10-CM | POA: Insufficient documentation

## 2020-10-22 LAB — POCT URINE PREGNANCY: Preg Test, Ur: NEGATIVE

## 2020-10-22 NOTE — Progress Notes (Signed)
  Subjective:     Patient ID: Kristine Lawrence, female   DOB: 03-22-1995, 25 y.o.   MRN: [id number]  HPI Kristine Lawrence is 25 year old black female,married, G0P0, in for IUD removal,wants to get pregnant.  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 Lab Results  Component Value Date   DIAGPAP  06/10/2019    - Negative for intraepithelial lesion or malignancy (NILM)   HPVHIGH Negative 06/10/2019    PCP is Dr Ladona Ridgel.  Review of Systems For IUD removal  Periods regular  Wants to get pregnant Reviewed past medical,surgical, social and family history. Reviewed medications and allergies.     Objective:   Physical Exam BP 129/84 (BP Location: Left Arm, Patient Position: Sitting, Cuff Size: Normal)   Pulse (!) 57   Ht 5\' 1"  (1.549 m)   Wt 147 lb (66.7 kg)   LMP 10/15/2020   BMI 27.78 kg/m  UPT is negative.Consent signed and time out called. Skin warm and dry.Pelvic: external genitalia is normal in appearance no lesions, vagina: pink,urethra has no lesions or masses noted, cervix:smooth, IUD strings seen and grasped with forceps and asked pt to cough and IUD easily removed, uterus: normal size, shape and contour, non tender, no masses felt, adnexa: no masses or tenderness noted. Bladder is non tender and no masses felt. Examination chaperoned by 12/15/2020 LPN     [facility] - 10/22/20 1203       Pregnancy Intention Screening   Does the patient want to become pregnant in the next year? Yes    Does the patient's partner want to become pregnant in the next year? Yes    Would the patient like to discuss contraceptive options today? No      Contraception Wrap Up   Current Method IUD or IUS    End Method Pregnant/Seeking Pregnancy    Contraception Counseling Provided No             Assessment:     1. Pregnancy test negative   2. Screen for STD (sexually transmitted disease) Urine sent for GC/CHL  3. Encounter for IUD removal   4. Patient desires pregnancy Start OTC PNV Discussed could take  6-18 months of active trying to get pregnant, or just once Have sex every other day, days 7-24 of cycle     Plan:     Call with +UPT

## 2020-10-26 LAB — GC/CHLAMYDIA PROBE AMP
Chlamydia trachomatis, NAA: NEGATIVE
Neisseria Gonorrhoeae by PCR: NEGATIVE

## 2021-01-10 ENCOUNTER — Other Ambulatory Visit: Payer: Self-pay

## 2021-01-10 ENCOUNTER — Ambulatory Visit (INDEPENDENT_AMBULATORY_CARE_PROVIDER_SITE_OTHER): Payer: No Typology Code available for payment source | Admitting: *Deleted

## 2021-01-10 VITALS — BP 129/79 | HR 64 | Ht 61.0 in | Wt 153.0 lb

## 2021-01-10 DIAGNOSIS — Z3201 Encounter for pregnancy test, result positive: Secondary | ICD-10-CM

## 2021-01-10 DIAGNOSIS — N926 Irregular menstruation, unspecified: Secondary | ICD-10-CM | POA: Diagnosis not present

## 2021-01-10 LAB — POCT URINE PREGNANCY: Preg Test, Ur: POSITIVE — AB

## 2021-01-10 NOTE — Progress Notes (Signed)
Chart reviewed for nurse visit. Agree with plan of care.  Adline Potter, NP 01/10/2021 4:45 PM

## 2021-01-10 NOTE — Progress Notes (Signed)
   NURSE VISIT- PREGNANCY CONFIRMATION   SUBJECTIVE:  Kristine Lawrence is a 25 y.o. G1P0000 female at [redacted]w[redacted]d by certain LMP of Patient's last menstrual period was 12/11/2020. Here for pregnancy confirmation.  Home pregnancy test: positive x 10   She reports cramping and mild nausea.  She is taking prenatal vitamins.    OBJECTIVE:  BP 129/79 (BP Location: Left Arm, Patient Position: Sitting, Cuff Size: Normal)   Pulse 64   Ht 5\' 1"  (1.549 m)   Wt 153 lb (69.4 kg)   LMP 12/11/2020   BMI 28.91 kg/m   Appears well, in no apparent distress  Results for orders placed or performed in visit on 01/10/21 (from the past 24 hour(s))  POCT urine pregnancy   Collection Time: 01/10/21  4:11 PM  Result Value Ref Range   Preg Test, Ur Positive (A) Negative    ASSESSMENT: Positive pregnancy test, [redacted]w[redacted]d by LMP    PLAN: Schedule for dating ultrasound in 3 weeks Prenatal vitamins: continue   Nausea medicines: not currently needed   OB packet given: Yes  [redacted]w[redacted]d  01/10/2021 4:19 PM

## 2021-01-11 ENCOUNTER — Telehealth: Payer: Self-pay | Admitting: Family Medicine

## 2021-01-11 DIAGNOSIS — Z13220 Encounter for screening for lipoid disorders: Secondary | ICD-10-CM

## 2021-01-11 DIAGNOSIS — Z130 Encounter for screening for diseases of the blood and blood-forming organs and certain disorders involving the immune mechanism: Secondary | ICD-10-CM

## 2021-01-11 DIAGNOSIS — Z131 Encounter for screening for diabetes mellitus: Secondary | ICD-10-CM

## 2021-01-11 NOTE — Telephone Encounter (Signed)
Pt has physical on 02/11/21 needing labs

## 2021-01-17 NOTE — Telephone Encounter (Signed)
Blood work ordered in Epic. Patient notified. 

## 2021-02-08 ENCOUNTER — Other Ambulatory Visit: Payer: Self-pay | Admitting: Obstetrics & Gynecology

## 2021-02-08 DIAGNOSIS — O3680X0 Pregnancy with inconclusive fetal viability, not applicable or unspecified: Secondary | ICD-10-CM

## 2021-02-09 ENCOUNTER — Encounter: Payer: No Typology Code available for payment source | Admitting: Adult Health

## 2021-02-09 ENCOUNTER — Other Ambulatory Visit: Payer: Self-pay

## 2021-02-09 ENCOUNTER — Other Ambulatory Visit: Payer: Self-pay | Admitting: Obstetrics & Gynecology

## 2021-02-09 ENCOUNTER — Ambulatory Visit (INDEPENDENT_AMBULATORY_CARE_PROVIDER_SITE_OTHER): Payer: No Typology Code available for payment source

## 2021-02-09 DIAGNOSIS — Z3A08 8 weeks gestation of pregnancy: Secondary | ICD-10-CM | POA: Diagnosis not present

## 2021-02-09 DIAGNOSIS — O3680X0 Pregnancy with inconclusive fetal viability, not applicable or unspecified: Secondary | ICD-10-CM

## 2021-02-10 ENCOUNTER — Telehealth: Payer: Self-pay | Admitting: Adult Health

## 2021-02-10 LAB — BETA HCG QUANT (REF LAB): hCG Quant: 28976 m[IU]/mL

## 2021-02-10 LAB — ABO/RH: Rh Factor: POSITIVE

## 2021-02-10 NOTE — Telephone Encounter (Signed)
Pt aware of QHCG and that will need to get follow up US to see if pregnancy progressing and blood type is AB+. Cal if any concerns

## 2021-02-10 NOTE — Progress Notes (Signed)
This encounter was created in error - please disregard.

## 2021-02-11 ENCOUNTER — Encounter: Payer: No Typology Code available for payment source | Admitting: Family Medicine

## 2021-02-17 ENCOUNTER — Other Ambulatory Visit: Payer: Self-pay | Admitting: Adult Health

## 2021-02-17 DIAGNOSIS — O3680X0 Pregnancy with inconclusive fetal viability, not applicable or unspecified: Secondary | ICD-10-CM

## 2021-02-18 ENCOUNTER — Other Ambulatory Visit: Payer: Self-pay

## 2021-02-18 ENCOUNTER — Other Ambulatory Visit: Payer: PRIVATE HEALTH INSURANCE

## 2021-02-18 ENCOUNTER — Ambulatory Visit (INDEPENDENT_AMBULATORY_CARE_PROVIDER_SITE_OTHER): Payer: PRIVATE HEALTH INSURANCE

## 2021-02-18 ENCOUNTER — Other Ambulatory Visit: Payer: Self-pay | Admitting: Adult Health

## 2021-02-18 DIAGNOSIS — O3680X0 Pregnancy with inconclusive fetal viability, not applicable or unspecified: Secondary | ICD-10-CM | POA: Diagnosis not present

## 2021-02-18 DIAGNOSIS — Z3A09 9 weeks gestation of pregnancy: Secondary | ICD-10-CM | POA: Diagnosis not present

## 2021-02-18 DIAGNOSIS — O200 Threatened abortion: Secondary | ICD-10-CM

## 2021-02-18 NOTE — Progress Notes (Signed)
F/U US 8+5 wks irregular gestational sac with yolk sac,1.8 mm fetal pole=5+5 wks ,no fetal heart rate visualized,normal ovaries,labs and f/u ultrasound scheduled per Cyril Mourning

## 2021-02-19 LAB — BETA HCG QUANT (REF LAB): hCG Quant: 41096 m[IU]/mL

## 2021-02-21 ENCOUNTER — Telehealth: Payer: Self-pay | Admitting: Adult Health

## 2021-02-21 NOTE — Telephone Encounter (Signed)
Pt aware labs rising but not doubling, and that [place] has fetal pole no FHM and growth is lagging. Has [place] appt 02/28/21 at 12:30 pm. This is looking like miscarriage but will wait til after [place]

## 2021-02-25 ENCOUNTER — Other Ambulatory Visit: Payer: Self-pay | Admitting: Adult Health

## 2021-02-25 DIAGNOSIS — O3680X0 Pregnancy with inconclusive fetal viability, not applicable or unspecified: Secondary | ICD-10-CM

## 2021-02-28 ENCOUNTER — Other Ambulatory Visit: Payer: Self-pay

## 2021-02-28 ENCOUNTER — Other Ambulatory Visit: Payer: Self-pay | Admitting: Adult Health

## 2021-02-28 ENCOUNTER — Ambulatory Visit (INDEPENDENT_AMBULATORY_CARE_PROVIDER_SITE_OTHER): Payer: PRIVATE HEALTH INSURANCE

## 2021-02-28 DIAGNOSIS — Z3A11 11 weeks gestation of pregnancy: Secondary | ICD-10-CM

## 2021-02-28 DIAGNOSIS — O3680X0 Pregnancy with inconclusive fetal viability, not applicable or unspecified: Secondary | ICD-10-CM

## 2021-02-28 MED ORDER — MISOPROSTOL 200 MCG PO TABS: ORAL_TABLET | ORAL | 1 refills | Status: DC

## 2021-02-28 NOTE — Progress Notes (Signed)
Missed ab, on [place] she is aware, no growth or heart beat, will proceed with Cytotec. We had already discussed this with last [place]. Use tylenol and advil for any pain. And follow up in about 2 weeks

## 2021-02-28 NOTE — Progress Notes (Signed)
[place] 5+5 wks fetal pole with enlarged yolk sac,no fetal heart tones visualized,CRL 2 mm,GS 29.1 mm=8 wks,Jennifer discussed results with patient and ordered Cytotec.

## 2021-03-14 ENCOUNTER — Ambulatory Visit: Payer: PRIVATE HEALTH INSURANCE | Admitting: Adult Health

## 2021-03-15 ENCOUNTER — Ambulatory Visit (INDEPENDENT_AMBULATORY_CARE_PROVIDER_SITE_OTHER): Payer: PRIVATE HEALTH INSURANCE | Admitting: Adult Health

## 2021-03-15 ENCOUNTER — Other Ambulatory Visit: Payer: Self-pay

## 2021-03-15 ENCOUNTER — Encounter: Payer: Self-pay | Admitting: Adult Health

## 2021-03-15 VITALS — BP 113/72 | HR 52 | Ht 62.0 in | Wt 156.0 lb

## 2021-03-15 DIAGNOSIS — O039 Complete or unspecified spontaneous abortion without complication: Secondary | ICD-10-CM | POA: Diagnosis not present

## 2021-03-15 NOTE — Progress Notes (Signed)
°  Subjective:     Patient ID: Kristine Lawrence, female   DOB: Oct 18, 1995, 26 y.o.   MRN: [id number]  HPI Kristine Lawrence is a 26 year old back female,married, G1P0010, in sp miscarriage, she took Cytotec and passed tissue. Still spotting some, no cramping PCP is Dr Lacinda Axon. Lab Results  Component Value Date   DIAGPAP  06/10/2019    - Negative for intraepithelial lesion or malignancy (NILM)   Satilla Negative 06/10/2019    Review of Systems +spotting No cramping Reviewed past medical,surgical, social and family history. Reviewed medications and allergies.     Objective:   Physical Exam BP 113/72 (BP Location: Left Arm, Patient Position: Sitting, Cuff Size: Normal)    Pulse (!) 52    Ht 5\' 2"  (1.575 m)    Wt 156 lb (70.8 kg)    LMP 12/11/2020    Breastfeeding No    BMI 28.53 kg/m     Skin warm and dry. Lungs: clear to ausculation bilaterally. Cardiovascular: regular rate and rhythm. Abdomen is soft and non tender. Blood type AB+ Fall risk is low  Upstream - 03/15/21 1619       Pregnancy Intention Screening   Does the patient want to become pregnant in the next year? Yes    Does the patient's partner want to become pregnant in the next year? Yes    Would the patient like to discuss contraceptive options today? No      Contraception Wrap Up   Current Method Abstinence    End Method Pregnant/Seeking Pregnancy    Contraception Counseling Provided No             Assessment:     1. Miscarriage Check QHCG  Continue PNV    Plan:     Follow up prn

## 2021-03-16 ENCOUNTER — Other Ambulatory Visit: Payer: Self-pay | Admitting: Adult Health

## 2021-03-16 DIAGNOSIS — O039 Complete or unspecified spontaneous abortion without complication: Secondary | ICD-10-CM

## 2021-03-16 LAB — BETA HCG QUANT (REF LAB): hCG Quant: 2181 m[IU]/mL

## 2021-03-16 NOTE — Progress Notes (Signed)
Ck QHCG in 2 weeks  

## 2021-03-29 LAB — BETA HCG QUANT (REF LAB): hCG Quant: 634 m[IU]/mL

## 2021-04-25 ENCOUNTER — Encounter: Payer: Self-pay | Admitting: Adult Health

## 2021-04-25 ENCOUNTER — Ambulatory Visit (INDEPENDENT_AMBULATORY_CARE_PROVIDER_SITE_OTHER): Payer: PRIVATE HEALTH INSURANCE | Admitting: Adult Health

## 2021-04-25 ENCOUNTER — Other Ambulatory Visit: Payer: Self-pay

## 2021-04-25 VITALS — BP 130/80 | HR 72 | Ht 62.0 in | Wt 154.0 lb

## 2021-04-25 DIAGNOSIS — O034 Incomplete spontaneous abortion without complication: Secondary | ICD-10-CM | POA: Diagnosis not present

## 2021-04-25 DIAGNOSIS — Z3201 Encounter for pregnancy test, result positive: Secondary | ICD-10-CM | POA: Insufficient documentation

## 2021-04-25 DIAGNOSIS — R9389 Abnormal findings on diagnostic imaging of other specified body structures: Secondary | ICD-10-CM | POA: Insufficient documentation

## 2021-04-25 LAB — POCT URINE PREGNANCY: Preg Test, Ur: POSITIVE — AB

## 2021-04-25 MED ORDER — MISOPROSTOL 200 MCG PO TABS: ORAL_TABLET | ORAL | 0 refills | Status: DC

## 2021-04-25 NOTE — Progress Notes (Signed)
?  Subjective:  ?  ? Patient ID: ZAIRA IACOVELLI, female   DOB: 1995/07/22, 26 y.o.   MRN: [id number] ? ?HPI ?Kamree is a 26 year old black female,married, G1P0, sp miscarriage in January still spotting brown, had period 2/26/ fr 5-6 day, no pain ?PCP is Dr Adriana Simas ? ?Review of Systems ?+spotting brown ?Denies any pain, feels good ?Reviewed past medical,surgical, social and family history. Reviewed medications and allergies.  ?   ?Objective:  ? Physical Exam ?BP 130/80 (BP Location: Left Arm, Patient Position: Sitting, Cuff Size: Normal)   Pulse 72   Ht 5\' 2"  (1.575 m)   Wt 154 lb (69.9 kg)   LMP 04/10/2021   Breastfeeding No   BMI 28.17 kg/m?  UPT is + ?Skin warm and dry.Pelvic: external genitalia is normal in appearance no lesions, vagina: brown discharge without odor,urethra has no lesions or masses noted, cervix:smooth,non tender, uterus: normal size, shape and contour, non tender, no masses felt, adnexa: no masses or tenderness noted. Bladder is non tender and no masses felt. ?  POC 04/12/2021 shows thickened endometrium about 12 mm. ?Fall risk is low ? Upstream - 04/25/21 1420   ? ?  ? Pregnancy Intention Screening  ? Does the patient want to become pregnant in the next year? Yes   ? Does the patient's partner want to become pregnant in the next year? Yes   ? Would the patient like to discuss contraceptive options today? No   ?  ? Contraception Wrap Up  ? Current Method Pregnant/Seeking Pregnancy   ? End Method Pregnant/Seeking Pregnancy   ? ?  ?  ? ?  ? Discussed with Dr 04/27/21, will rx cytotec ? ?Assessment:  ?  1. Pregnancy examination or test, positive result ? ?2. Thickened endometrium ? ?3. Retained products of conception after miscarriage ?Will rx Cytotec 400 mcg tid for 3 days to aide in expelling of tissue  ?Meds ordered this encounter  ?Medications  ? DISCONTD: misoprostol (CYTOTEC) 200 MCG tablet  ?  Sig: Take 2  200 mc tablets tid for 3 days  ?  Dispense:  18 tablet  ?  Refill:  0  ?  Order Specific Question:    Supervising Provider  ?  Answer:   Despina Hidden H [[id number]]  ? misoprostol (CYTOTEC) 200 MCG tablet  ?  Sig: Take 2  200 mcg tablets tid for 3 days  ?  Dispense:  18 tablet  ?  Refill:  0  ?  Order Specific Question:   Supervising Provider  ?  Answer:   Duane Lope H [[id number]]  ?  ? ?   ?Plan:  ?   ?Follow up with me in 2 weeks for ROS ?   ?

## 2021-05-09 ENCOUNTER — Other Ambulatory Visit: Payer: Self-pay

## 2021-05-09 ENCOUNTER — Encounter: Payer: Self-pay | Admitting: Adult Health

## 2021-05-09 ENCOUNTER — Ambulatory Visit (INDEPENDENT_AMBULATORY_CARE_PROVIDER_SITE_OTHER): Payer: PRIVATE HEALTH INSURANCE | Admitting: Adult Health

## 2021-05-09 VITALS — BP 126/81 | HR 53 | Ht 61.0 in | Wt 155.4 lb

## 2021-05-09 DIAGNOSIS — Z3202 Encounter for pregnancy test, result negative: Secondary | ICD-10-CM

## 2021-05-09 DIAGNOSIS — O039 Complete or unspecified spontaneous abortion without complication: Secondary | ICD-10-CM | POA: Insufficient documentation

## 2021-05-09 LAB — POCT URINE PREGNANCY: Preg Test, Ur: NEGATIVE

## 2021-05-09 NOTE — Progress Notes (Signed)
?  Subjective:  ?  ? Patient ID: Kristine Lawrence, female   DOB: [birth year], 26 y.o.   MRN: [id number] ? ?HPI ?Kristine Lawrence is a 26 year old black female, married, G1P0010, sp miscarriage in January had spotting and no pain ,was seen  04/25/21 and POC US showed thickened endometrium, she ws given Cytotec 400 mcg tid x 3 days and had bleeding x 7 days. ?PCP is Dr Adriana Simas. ?Lab Results  ?Component Value Date  ? DIAGPAP  06/10/2019  ?  - Negative for intraepithelial lesion or malignancy (NILM)  ? HPVHIGH Negative 06/10/2019  ?  ?Review of Systems ?No pain or bleeding now ?Reviewed past medical,surgical, social and family history. Reviewed medications and allergies.  ?   ?Objective:  ? Physical Exam ?BP 126/81 (BP Location: Right Arm, Patient Position: Sitting, Cuff Size: Normal)   Pulse (!) 53   Ht 5\' 1"  (1.549 m)   Wt 155 lb 6.4 oz (70.5 kg)   LMP  (LMP Unknown)   Breastfeeding No   BMI 29.36 kg/m?   ?  UPT is negative.Skin warm and dry.  Lungs: clear to ausculation bilaterally. Cardiovascular: regular rate and rhythm.  ? Upstream - 05/09/21 1352   ? ?  ? Pregnancy Intention Screening  ? Does the patient want to become pregnant in the next year? Yes   ? Does the patient's partner want to become pregnant in the next year? Yes   ? Would the patient like to discuss contraceptive options today? No   ?  ? Contraception Wrap Up  ? Current Method Pregnant/Seeking Pregnancy   ? End Method Pregnant/Seeking Pregnancy   ? Contraception Counseling Provided No   ? ?  ?  ? ?  ?  ?Assessment:  ?   1. Miscarriage ?Continue PNV ?Wait till next period and resume trying to get pregnant  ?Aware could happen first time or be 6-18 months of active trying  ?   ?Plan:  ?   ?Follow up prn  ?   ?

## 2021-08-15 LAB — OB RESULTS CONSOLE ANTIBODY SCREEN: Antibody Screen: NEGATIVE

## 2021-09-28 LAB — OB RESULTS CONSOLE RPR: RPR: NONREACTIVE

## 2021-09-28 LAB — OB RESULTS CONSOLE HEPATITIS B SURFACE ANTIGEN: Hepatitis B Surface Ag: NEGATIVE

## 2021-09-28 LAB — OB RESULTS CONSOLE RUBELLA ANTIBODY, IGM: Rubella: IMMUNE

## 2021-09-28 LAB — OB RESULTS CONSOLE HIV ANTIBODY (ROUTINE TESTING): HIV: NONREACTIVE

## 2021-10-21 LAB — OB RESULTS CONSOLE GC/CHLAMYDIA
Chlamydia: NEGATIVE
Neisseria Gonorrhea: NEGATIVE

## 2022-02-13 NOTE — L&D Delivery Note (Signed)
Operative Delivery Note At 12:52 PM a viable and healthy female was delivered via .  Presentation: vertex; Position: Left,, Occiput,, Anterior; Station: +3.  Verbal consent: obtained from patient.  Indication: maternal exhaustion and recurrent fetal decelerations. Risks and benefits discussed in detail.  Risks include, but are not limited to the risks of anesthesia, bleeding, infection, damage to maternal tissues, fetal cephalhematoma.  There is also the risk of inability to effect vaginal delivery of the head, or shoulder dystocia that cannot be resolved by established maneuvers, leading to the need for emergency cesarean section.  APGAR: 7, 9; weight pending .   Placenta status: spontaneous, intact.   Cord:  spontaneous with the following complications: none.  Cord pH: na  Anesthesia:  epidural Instruments: kiwi x 3 pulls, one pop off Episiotomy:  na Lacerations:  na Suture Repair:  na Est. Blood Loss (mL):122    Mom to postpartum.  Baby to Couplet care / Skin to Skin.  Marice Guidone J 04/23/2022, 1:04 PM

## 2022-03-22 LAB — OB RESULTS CONSOLE GBS: GBS: NEGATIVE

## 2022-04-19 ENCOUNTER — Encounter (HOSPITAL_COMMUNITY): Payer: Self-pay | Admitting: *Deleted

## 2022-04-19 ENCOUNTER — Telehealth (HOSPITAL_COMMUNITY): Payer: Self-pay | Admitting: *Deleted

## 2022-04-19 NOTE — Telephone Encounter (Signed)
Preadmission screen  

## 2022-04-21 ENCOUNTER — Other Ambulatory Visit: Payer: Self-pay | Admitting: Obstetrics and Gynecology

## 2022-04-22 ENCOUNTER — Inpatient Hospital Stay (HOSPITAL_COMMUNITY)
Admission: AD | Admit: 2022-04-22 | Discharge: 2022-04-22 | Disposition: A | Discharge status: Home / Self Care | Payer: PRIVATE HEALTH INSURANCE | Source: Home / Self Care | Attending: Obstetrics and Gynecology | Admitting: Obstetrics and Gynecology

## 2022-04-22 ENCOUNTER — Encounter (HOSPITAL_COMMUNITY): Payer: Self-pay | Admitting: Obstetrics and Gynecology

## 2022-04-22 ENCOUNTER — Inpatient Hospital Stay (HOSPITAL_COMMUNITY)
Admission: AD | Admit: 2022-04-22 | Discharge: 2022-04-24 | DRG: 807 | Disposition: A | Discharge status: Home / Self Care | Payer: PRIVATE HEALTH INSURANCE | Attending: Obstetrics and Gynecology | Admitting: Obstetrics and Gynecology

## 2022-04-22 ENCOUNTER — Other Ambulatory Visit: Payer: Self-pay

## 2022-04-22 DIAGNOSIS — Z3A39 39 weeks gestation of pregnancy: Secondary | ICD-10-CM

## 2022-04-22 DIAGNOSIS — O471 False labor at or after 37 completed weeks of gestation: Secondary | ICD-10-CM | POA: Insufficient documentation

## 2022-04-22 DIAGNOSIS — R7302 Impaired glucose tolerance (oral): Secondary | ICD-10-CM | POA: Diagnosis present

## 2022-04-22 DIAGNOSIS — O403XX0 Polyhydramnios, third trimester, not applicable or unspecified: Principal | ICD-10-CM | POA: Diagnosis present

## 2022-04-22 DIAGNOSIS — O99214 Obesity complicating childbirth: Secondary | ICD-10-CM | POA: Diagnosis present

## 2022-04-22 DIAGNOSIS — O479 False labor, unspecified: Secondary | ICD-10-CM

## 2022-04-22 DIAGNOSIS — O99892 Other specified diseases and conditions complicating childbirth: Secondary | ICD-10-CM | POA: Diagnosis present

## 2022-04-22 MED ORDER — HYDROXYZINE HCL 50 MG PO TABS
50.0000 mg | ORAL_TABLET | Freq: Once | ORAL | Status: AC
  Administered 2022-04-22: 50 mg via ORAL
  Filled 2022-04-22 (×2): qty 1

## 2022-04-22 NOTE — MAU Provider Note (Signed)
MSE done.  [name] is a 27 y.o. female G2P0010 @ 59w5dhere in MAU with contractions. She stayed in MAU for several hours with no change in her cervix. RN was instructed to call OB and discuss the possibility for admission today. OB recommends patient be sent home and return if contractions worsen.   Discussed patient with Dr. FMikel Cella reviewed BP readings, NST and DC home order per Dr. AMurrell Redden  Dr. FMikel Cellais ok for DC given normal BP's, reactive NST and plan for induction tomorrow.  Patient is comfortable going home and is very agreeable to return when contractions worsen.  Category 1 fetal tracing  Today's Vitals   04/22/22 0421 04/22/22 0423 04/22/22 0440 04/22/22 0802  BP: 133/79  131/79   Pulse: 82  90   Resp: 20     Temp: 98.2 F (36.8 C)     TempSrc: Oral     SpO2: 98%     Weight: 88.7 kg     Height: '5\' 2"'$  (1.575 m)     PainSc:  8   8    Body mass index is 35.78 kg/m.   RLezlie Lye NP 04/22/2022 8:53 AM

## 2022-04-22 NOTE — MAU Note (Signed)
.  Kristine Lawrence is a 27 y.o. at [redacted]w[redacted]d here in MAU reporting: this morning started with period like cramps - very mild. Around 2000 got more intense and was 15 minutes apart, but have been every 3-5 minutes since 0030. Denies LOF or VB. +FM. Was 2-3cm dilated in the office on Wednesday. States she is supposed to be induced tomorrow for hypertension.   Pain score: 8 Vitals:   04/22/22 0421  BP: 133/79  Pulse: 82  Resp: 20  Temp: 98.2 F (36.8 C)  SpO2: 98%     FHT:145 Lab orders placed from triage:  labor eval

## 2022-04-23 ENCOUNTER — Inpatient Hospital Stay (HOSPITAL_COMMUNITY): Payer: PRIVATE HEALTH INSURANCE | Admitting: Anesthesiology

## 2022-04-23 ENCOUNTER — Inpatient Hospital Stay (HOSPITAL_COMMUNITY): Payer: PRIVATE HEALTH INSURANCE

## 2022-04-23 ENCOUNTER — Encounter (HOSPITAL_COMMUNITY): Payer: Self-pay | Admitting: Obstetrics and Gynecology

## 2022-04-23 DIAGNOSIS — O403XX0 Polyhydramnios, third trimester, not applicable or unspecified: Secondary | ICD-10-CM | POA: Diagnosis present

## 2022-04-23 DIAGNOSIS — O99892 Other specified diseases and conditions complicating childbirth: Secondary | ICD-10-CM | POA: Diagnosis present

## 2022-04-23 DIAGNOSIS — O99214 Obesity complicating childbirth: Secondary | ICD-10-CM | POA: Diagnosis present

## 2022-04-23 DIAGNOSIS — O26893 Other specified pregnancy related conditions, third trimester: Secondary | ICD-10-CM | POA: Diagnosis present

## 2022-04-23 DIAGNOSIS — Z3A39 39 weeks gestation of pregnancy: Secondary | ICD-10-CM | POA: Diagnosis not present

## 2022-04-23 DIAGNOSIS — R7302 Impaired glucose tolerance (oral): Secondary | ICD-10-CM | POA: Diagnosis present

## 2022-04-23 LAB — CBC
HCT: 38.2 % (ref 36.0–46.0)
Hemoglobin: 12.9 g/dL (ref 12.0–15.0)
MCH: 30.7 pg (ref 26.0–34.0)
MCHC: 33.8 g/dL (ref 30.0–36.0)
MCV: 91 fL (ref 80.0–100.0)
Platelets: 166 10*3/uL (ref 150–400)
RBC: 4.2 MIL/uL (ref 3.87–5.11)
RDW: 13.4 % (ref 11.5–15.5)
WBC: 10.2 10*3/uL (ref 4.0–10.5)
nRBC: 0 % (ref 0.0–0.2)

## 2022-04-23 LAB — TYPE AND SCREEN
ABO/RH(D): AB POS
Antibody Screen: NEGATIVE

## 2022-04-23 LAB — RPR: RPR Ser Ql: NONREACTIVE

## 2022-04-23 MED ORDER — ACETAMINOPHEN 325 MG PO TABS: 650.0000 mg | ORAL_TABLET | ORAL | Status: DC | PRN

## 2022-04-23 MED ORDER — FENTANYL-BUPIVACAINE-NACL 0.5-0.125-0.9 MG/250ML-% EP SOLN
12.0000 mL/h | EPIDURAL | Status: DC | PRN
  Administered 2022-04-23: 12 mL/h via EPIDURAL

## 2022-04-23 MED ORDER — SOD CITRATE-CITRIC ACID 500-334 MG/5ML PO SOLN: 30.0000 mL | ORAL | Status: DC | PRN

## 2022-04-23 MED ORDER — COCONUT OIL OIL: 1.0000 | TOPICAL_OIL | Status: DC | PRN

## 2022-04-23 MED ORDER — OXYCODONE HCL 5 MG PO TABS: 10.0000 mg | ORAL_TABLET | ORAL | Status: DC | PRN

## 2022-04-23 MED ORDER — METHYLERGONOVINE MALEATE 0.2 MG PO TABS: 0.2000 mg | ORAL_TABLET | ORAL | Status: DC | PRN

## 2022-04-23 MED ORDER — SENNOSIDES-DOCUSATE SODIUM 8.6-50 MG PO TABS
2.0000 | ORAL_TABLET | Freq: Every day | ORAL | Status: DC
  Administered 2022-04-24: 2 via ORAL
  Filled 2022-04-23: qty 2

## 2022-04-23 MED ORDER — PHENYLEPHRINE 80 MCG/ML (10ML) SYRINGE FOR IV PUSH (FOR BLOOD PRESSURE SUPPORT): 80.0000 ug | PREFILLED_SYRINGE | INTRAVENOUS | Status: DC | PRN

## 2022-04-23 MED ORDER — LIDOCAINE HCL (PF) 1 % IJ SOLN
INTRAMUSCULAR | Status: DC | PRN
  Administered 2022-04-23 (×2): 5 mL via EPIDURAL

## 2022-04-23 MED ORDER — ZOLPIDEM TARTRATE 5 MG PO TABS: 5.0000 mg | ORAL_TABLET | Freq: Every evening | ORAL | Status: DC | PRN

## 2022-04-23 MED ORDER — DIBUCAINE (PERIANAL) 1 % EX OINT: 1.0000 | TOPICAL_OINTMENT | CUTANEOUS | Status: DC | PRN

## 2022-04-23 MED ORDER — SIMETHICONE 80 MG PO CHEW: 80.0000 mg | CHEWABLE_TABLET | ORAL | Status: DC | PRN

## 2022-04-23 MED ORDER — LACTATED RINGERS IV SOLN: INTRAVENOUS | Status: DC

## 2022-04-23 MED ORDER — BENZOCAINE-MENTHOL 20-0.5 % EX AERO: 1.0000 | INHALATION_SPRAY | CUTANEOUS | Status: DC | PRN

## 2022-04-23 MED ORDER — FLEET ENEMA 7-19 GM/118ML RE ENEM: 1.0000 | ENEMA | RECTAL | Status: DC | PRN

## 2022-04-23 MED ORDER — LACTATED RINGERS IV SOLN
500.0000 mL | INTRAVENOUS | Status: DC | PRN
  Administered 2022-04-23: 300 mL via INTRAVENOUS

## 2022-04-23 MED ORDER — TRANEXAMIC ACID-NACL 1000-0.7 MG/100ML-% IV SOLN
1000.0000 mg | INTRAVENOUS | Status: AC
  Administered 2022-04-23: 1000 mg via INTRAVENOUS

## 2022-04-23 MED ORDER — ONDANSETRON HCL 4 MG/2ML IJ SOLN: 4.0000 mg | INTRAMUSCULAR | Status: DC | PRN

## 2022-04-23 MED ORDER — OXYCODONE-ACETAMINOPHEN 5-325 MG PO TABS: 2.0000 | ORAL_TABLET | ORAL | Status: DC | PRN

## 2022-04-23 MED ORDER — PRENATAL MULTIVITAMIN CH
1.0000 | ORAL_TABLET | Freq: Every day | ORAL | Status: DC
  Administered 2022-04-24: 1 via ORAL
  Filled 2022-04-23: qty 1

## 2022-04-23 MED ORDER — WITCH HAZEL-GLYCERIN EX PADS: 1.0000 | MEDICATED_PAD | CUTANEOUS | Status: DC | PRN

## 2022-04-23 MED ORDER — DIPHENHYDRAMINE HCL 25 MG PO CAPS: 25.0000 mg | ORAL_CAPSULE | Freq: Four times a day (QID) | ORAL | Status: DC | PRN

## 2022-04-23 MED ORDER — IBUPROFEN 600 MG PO TABS
600.0000 mg | ORAL_TABLET | Freq: Four times a day (QID) | ORAL | Status: DC
  Administered 2022-04-24 (×3): 600 mg via ORAL
  Filled 2022-04-23 (×4): qty 1

## 2022-04-23 MED ORDER — OXYCODONE HCL 5 MG PO TABS: 5.0000 mg | ORAL_TABLET | ORAL | Status: DC | PRN

## 2022-04-23 MED ORDER — OXYCODONE-ACETAMINOPHEN 5-325 MG PO TABS: 1.0000 | ORAL_TABLET | ORAL | Status: DC | PRN

## 2022-04-23 MED ORDER — TETANUS-DIPHTH-ACELL PERTUSSIS 5-2.5-18.5 LF-MCG/0.5 IM SUSY: 0.5000 mL | PREFILLED_SYRINGE | Freq: Once | INTRAMUSCULAR | Status: DC

## 2022-04-23 MED ORDER — LACTATED RINGERS IV SOLN: 500.0000 mL | Freq: Once | INTRAVENOUS | Status: DC

## 2022-04-23 MED ORDER — EPHEDRINE 5 MG/ML INJ: 10.0000 mg | INTRAVENOUS | Status: DC | PRN

## 2022-04-23 MED ORDER — ONDANSETRON HCL 4 MG PO TABS: 4.0000 mg | ORAL_TABLET | ORAL | Status: DC | PRN

## 2022-04-23 MED ORDER — FENTANYL-BUPIVACAINE-NACL 0.5-0.125-0.9 MG/250ML-% EP SOLN
EPIDURAL | Status: AC
  Filled 2022-04-23: qty 250

## 2022-04-23 MED ORDER — OXYTOCIN BOLUS FROM INFUSION
333.0000 mL | Freq: Once | INTRAVENOUS | Status: AC
  Administered 2022-04-23: 333 mL via INTRAVENOUS

## 2022-04-23 MED ORDER — ONDANSETRON HCL 4 MG/2ML IJ SOLN: 4.0000 mg | Freq: Four times a day (QID) | INTRAMUSCULAR | Status: DC | PRN

## 2022-04-23 MED ORDER — OXYTOCIN-SODIUM CHLORIDE 30-0.9 UT/500ML-% IV SOLN
2.5000 [IU]/h | INTRAVENOUS | Status: DC
  Administered 2022-04-23: 2.5 [IU]/h via INTRAVENOUS
  Filled 2022-04-23: qty 500

## 2022-04-23 MED ORDER — DIPHENHYDRAMINE HCL 50 MG/ML IJ SOLN: 12.5000 mg | INTRAMUSCULAR | Status: DC | PRN

## 2022-04-23 MED ORDER — METHYLERGONOVINE MALEATE 0.2 MG/ML IJ SOLN: 0.2000 mg | INTRAMUSCULAR | Status: DC | PRN

## 2022-04-23 MED ORDER — TRANEXAMIC ACID-NACL 1000-0.7 MG/100ML-% IV SOLN
INTRAVENOUS | Status: AC
  Filled 2022-04-23: qty 100

## 2022-04-23 MED ORDER — LIDOCAINE HCL (PF) 1 % IJ SOLN: 30.0000 mL | INTRAMUSCULAR | Status: DC | PRN

## 2022-04-23 NOTE — Anesthesia Preprocedure Evaluation (Signed)
Anesthesia Evaluation  Patient identified by MRN, date of birth, ID band Patient awake    Reviewed: Allergy & Precautions, Patient's Chart, lab work & pertinent test results  Airway Mallampati: II       Dental no notable dental hx. (+) Teeth Intact   Pulmonary neg pulmonary ROS   Pulmonary exam normal        Cardiovascular negative cardio ROS Normal cardiovascular exam     Neuro/Psych negative neurological ROS  negative psych ROS   GI/Hepatic Neg liver ROS,GERD  ,,  Endo/Other  Obesity  Renal/GU negative Renal ROS  negative genitourinary   Musculoskeletal negative musculoskeletal ROS (+)    Abdominal  (+) + obese  Peds  Hematology negative hematology ROS (+)   Anesthesia Other Findings   Reproductive/Obstetrics (+) Pregnancy                              Anesthesia Physical Anesthesia Plan  ASA: 2  Anesthesia Plan: Epidural   Post-op Pain Management: Epidural*   Induction: Intravenous  PONV Risk Score and Plan: Treatment may vary due to age or medical condition  Airway Management Planned: Natural Airway  Additional Equipment: Fetal Monitoring and None  Intra-op Plan:   Post-operative Plan:   Informed Consent: I have reviewed the patients History and Physical, chart, labs and discussed the procedure including the risks, benefits and alternatives for the proposed anesthesia with the patient or authorized representative who has indicated his/her understanding and acceptance.       Plan Discussed with: Anesthesiologist  Anesthesia Plan Comments:          Anesthesia Quick Evaluation

## 2022-04-23 NOTE — Progress Notes (Signed)
Kristine Lawrence is a 27 y.o. G2P0010 at 67w6dby LMP admitted for active labor  Subjective: Occ pressure  Objective: BP 122/65   Pulse 92   Temp 98.2 F (36.8 C) (Oral)   Resp 20   Ht '5\' 2"'$  (1.575 m)   Wt 87.5 kg   LMP  (LMP Unknown)   SpO2 100%   BMI 35.30 kg/m  No intake/output data recorded. No intake/output data recorded.  FHT:  FHR: 155 bpm, variability: moderate,  accelerations:  Present,  decelerations:  Absent UC:   regular, every 2 minutes SVE:   Dilation: Lip/rim Effacement (%): 100 Station: Plus 1 Exam by:: Foley,rnc  Labs: Lab Results  Component Value Date   WBC 10.2 04/23/2022   HGB 12.9 04/23/2022   HCT 38.2 04/23/2022   MCV 91.0 04/23/2022   PLT 166 04/23/2022    Assessment / Plan: Spontaneous labor, progressing normally Borderline pelvimetry but good progress and descent noted  Labor: Progressing normally Preeclampsia:  no signs or symptoms of toxicity Fetal Wellbeing:  Category I Pain Control:  Epidural I/D:  n/a Anticipated MOD:  guarded  TLovenia Kim MD 04/23/2022, 11:19 AM

## 2022-04-23 NOTE — Lactation Note (Signed)
This note was copied from a baby's chart. Lactation Consultation Note  Patient Name: Kristine Lawrence [id number] Date: 04/23/2022 Age:29 hours Reason for consult: Initial assessment;Primapara;Term LC assisted in cross cradle position. Baby latched well. Suggested using support and props. Newborn feeding habits, behavior, positions, body alignment, STS, I&O, support reviewed. Mom encouraged to feed baby 8-12 times/24 hours and with feeding cues.  Mom wanted to know how to do football. LC placed baby in football position.  Baby BF great. Showed signs of fulfillment. Parents happy. Breast care reviewed. Encouraged to call for assistance if needed.  Maternal Data Has patient been taught Hand Expression?: Yes Does the patient have breastfeeding experience prior to this delivery?: No  Feeding    LATCH Score Latch: Grasps breast easily, tongue down, lips flanged, rhythmical sucking.  Audible Swallowing: A few with stimulation  Type of Nipple: Everted at rest and after stimulation  Comfort (Breast/Nipple): Soft / non-tender  Hold (Positioning): Assistance needed to correctly position infant at breast and maintain latch.  LATCH Score: 8   Lactation Tools Discussed/Used    Interventions Interventions: Breast feeding basics reviewed;Adjust position;Assisted with latch;Support pillows;Skin to skin;Position options;Education;Breast massage;Hand express;LC Services brochure;Breast compression  Discharge    Consult Status Consult Status: Follow-up Date: 04/24/22 Follow-up type: In-patient    Theodoro Kalata 04/23/2022, 9:45 PM

## 2022-04-23 NOTE — Anesthesia Procedure Notes (Signed)
Epidural Patient location during procedure: OB Start time: 04/23/2022 3:32 AM End time: 04/23/2022 3:39 AM  Preanesthetic Checklist Completed: patient identified, IV checked, site marked, risks and benefits discussed, surgical consent, monitors and equipment checked, pre-op evaluation and timeout performed  Epidural Patient position: sitting Prep: DuraPrep and site prepped and draped Patient monitoring: continuous pulse ox and blood pressure Approach: midline Location: L3-L4 Injection technique: LOR air  Needle:  Needle type: Tuohy  Needle gauge: 17 G Needle length: 9 cm and 9 Needle insertion depth: 5 cm Catheter type: closed end flexible Catheter size: 19 Gauge Catheter at skin depth: 10 cm Test dose: negative and Other  Assessment Events: blood not aspirated, no cerebrospinal fluid, injection not painful, no injection resistance, no paresthesia and negative IV test  Additional Notes Patient identified. Risks and benefits discussed including failed block, incomplete  Pain control, post dural puncture headache, nerve damage, paralysis, blood pressure Changes, nausea, vomiting, reactions to medications-both toxic and allergic and post Partum back pain. All questions were answered. Patient expressed understanding and wished to proceed. Sterile technique was used throughout procedure. Epidural site was Dressed with sterile barrier dressing. No paresthesias, signs of intravascular injection Or signs of intrathecal spread were encountered.  Patient was more comfortable after the epidural was dosed. Please see RN's note for documentation of vital signs and FHR which are stable. Reason for block:procedure for pain

## 2022-04-23 NOTE — H&P (Addendum)
Kristine Lawrence is a 27 y.o. female presenting for labor. OB History     Gravida  2   Para  0   Term  0   Preterm  0   AB  1   Living  0      SAB  1   IAB  0   Ectopic  0   Multiple  0   Live Births  0          Past Medical History:  Diagnosis Date   Miscarriage    Mono exposure    Past Surgical History:  Procedure Laterality Date   WISDOM TOOTH EXTRACTION     Family History: family history includes Diabetes in her paternal grandfather and paternal grandmother; Hypertension in her maternal grandfather, maternal grandmother, and mother; Lupus in her maternal grandmother. Social History:  reports that she has never smoked. She has never used smokeless tobacco. She reports current alcohol use. She reports that she does not use drugs.     Maternal Diabetes: No Genetic Screening: Normal Maternal Ultrasounds/Referrals: Normal Fetal Ultrasounds or other Referrals:  None Maternal Substance Abuse:  No Significant Maternal Medications:  None Significant Maternal Lab Results:  Group B Strep negative Number of Prenatal Visits:greater than 3 verified prenatal visits Other Comments:  None  Review of Systems  Constitutional: Negative.   All other systems reviewed and are negative.  Maternal Medical History:  Reason for admission: Contractions.   Contractions: Onset was 3-5 hours ago.   Frequency: regular.   Perceived severity is moderate.   Fetal activity: Perceived fetal activity is normal.   Last perceived fetal movement was within the past hour.   Prenatal complications: Polyhydramnios.   Prenatal Complications - Diabetes: none.   Dilation: 6.5 Effacement (%): 100 Station: -2 Exam by:: Foley,rnc Blood pressure 125/73, pulse 95, temperature 98.1 F (36.7 C), temperature source Oral, resp. rate 20, height '5\' 2"'$  (1.575 m), weight 87.5 kg, SpO2 100 %. Maternal Exam:  Uterine Assessment: Contraction strength is moderate.  Contraction frequency is regular.   Abdomen: Patient reports no abdominal tenderness. Fetal presentation: vertex Introitus: Normal vulva. Ferning test: not done.  Nitrazine test: not done. Amniotic fluid character: not assessed. Pelvis: of concern for delivery.   Cervix: Cervix evaluated by digital exam.     Physical Exam Constitutional:      Appearance: Normal appearance. She is normal weight.  HENT:     Head: Normocephalic and atraumatic.  Cardiovascular:     Rate and Rhythm: Normal rate and regular rhythm.  Pulmonary:     Effort: Pulmonary effort is normal.     Breath sounds: Normal breath sounds.  Abdominal:     General: Bowel sounds are normal.     Palpations: Abdomen is soft.  Genitourinary:    General: Normal vulva.  Musculoskeletal:        General: Normal range of motion.     Cervical back: Normal range of motion and neck supple.  Skin:    General: Skin is warm and dry.  Neurological:     General: No focal deficit present.     Mental Status: She is alert and oriented to person, place, and time.  Psychiatric:        Mood and Affect: Mood normal.        Behavior: Behavior normal.     Prenatal labs: ABO, Rh: --/--/AB POS (03/10 0127) Antibody: NEG (03/10 0127) Rubella: Immune (08/16 0000) RPR: Nonreactive (08/16 0000)  HBsAg: Negative (08/16 0000)  HIV: Non-reactive (08/16 0000)  GBS: Negative/-- (02/07 0000)   Assessment/Plan: Term IUP in early labor Impaired glucose tolerance with poly Borderline pelvimetry Admit   Rainy Rothman J 04/23/2022, 8:46 AM

## 2022-04-23 NOTE — MAU Note (Signed)
.  Kristine Lawrence is a 27 y.o. at [redacted]w[redacted]d here in MAU reporting: ctx for the last 27 hrs. Have been getting strong and closer and now unablet o tolerate them about 5-7 min apart. Reports some bloody show no leaking. Good fetal movement reported. Pt was here earlier today and she was 2 cm and sent home.  LMP:  Onset of complaint: yesterday Pain score: 10 Vitals:   04/23/22 0019  BP: 130/81  Pulse: 84  Resp: 18  Temp: 97.9 F (36.6 C)     FHT:147 Lab orders placed from triage:  labor eval

## 2022-04-24 LAB — CBC
HCT: 31.7 % — ABNORMAL LOW (ref 36.0–46.0)
Hemoglobin: 10.6 g/dL — ABNORMAL LOW (ref 12.0–15.0)
MCH: 30.4 pg (ref 26.0–34.0)
MCHC: 33.4 g/dL (ref 30.0–36.0)
MCV: 90.8 fL (ref 80.0–100.0)
Platelets: 157 10*3/uL (ref 150–400)
RBC: 3.49 MIL/uL — ABNORMAL LOW (ref 3.87–5.11)
RDW: 13.7 % (ref 11.5–15.5)
WBC: 13.8 10*3/uL — ABNORMAL HIGH (ref 4.0–10.5)
nRBC: 0 % (ref 0.0–0.2)

## 2022-04-24 NOTE — Anesthesia Postprocedure Evaluation (Signed)
Anesthesia Post Note  Patient: Kristine Lawrence  Procedure(s) Performed: AN AD HOC LABOR EPIDURAL     Patient location during evaluation: Mother Baby Anesthesia Type: Epidural Level of consciousness: awake Pain management: satisfactory to patient Vital Signs Assessment: post-procedure vital signs reviewed and stable Respiratory status: spontaneous breathing Cardiovascular status: stable Anesthetic complications: no  No notable events documented.  Last Vitals:  Vitals:   04/24/22 0300 04/24/22 0613  BP: 118/68 (!) 96/59  Pulse: 90 79  Resp: 18 18  Temp: 36.6 C   SpO2: 99% 99%    Last Pain:  Vitals:   04/24/22 0613  TempSrc:   PainSc: 0-No pain   Pain Goal:                   Kristine Lawrence

## 2022-04-24 NOTE — Discharge Summary (Signed)
OB Discharge Summary  Patient Name: Kristine Lawrence DOB: April 14, 1995 MRN: [id number]  Date of admission: 04/22/2022 Delivering provider: Brien Few   Admitting diagnosis: Normal labor [O80, Z37.9] Intrauterine pregnancy: [redacted]w[redacted]d    Secondary diagnosis: Patient Active Problem List   Diagnosis Date Noted   Vacuum-assisted vaginal delivery 04/24/2022   Postpartum care following vaginal delivery 3/10 04/24/2022   Normal labor 04/23/2022   Family history of breast cancer 06/10/2019    Date of discharge: 04/24/2022   Discharge diagnosis: Principal Problem:   Postpartum care following vaginal delivery 3/10 Active Problems:   Normal labor   Vacuum-assisted vaginal delivery                                                            Augmentation: N/A Pain control: Epidural  Laceration:None  Complications: None  Hospital course:  Onset of Labor With Vaginal Delivery      27y.o. yo G2P1011 at 350w6das admitted in Active Labor on 04/22/2022. Labor course was complicated by vacuum assisted vaginal delivery.   Membrane Rupture Time/Date: 5:30 AM ,04/23/2022   Delivery Method:Vaginal, Vacuum (Extractor)  Episiotomy: None  Lacerations:  None  Patient had an uncomplicated postpartum course. She is ambulating, tolerating a regular diet, passing flatus, and urinating well. Patient is discharged home in stable condition on 04/24/22.  Newborn Data: Birth date:04/23/2022  Birth time:12:52 PM  Gender:Female  Living status:Living  Apgars:5 ,9  Weight:3610 g   Physical exam  Vitals:   04/23/22 2010 04/23/22 2310 04/24/22 0300 04/24/22 0613  BP: 112/70 120/77 118/68 (!) 96/59  Pulse: 93 95 90 79  Resp: '18 18 18 18  '$ Temp: 97.9 F (36.6 C) 97.7 F (36.5 C) 97.9 F (36.6 C)   TempSrc: Oral Oral Oral   SpO2: 99% 99% 99% 99%  Weight:      Height:       General: alert, cooperative, and no distress Lochia: appropriate Uterine Fundus: firm Perineum: intact DVT Evaluation: No evidence  of DVT seen on physical exam.  Labs: Lab Results  Component Value Date   WBC 13.8 (H) 04/24/2022   HGB 10.6 (L) 04/24/2022   HCT 31.7 (L) 04/24/2022   MCV 90.8 04/24/2022   PLT 157 04/24/2022      04/23/2022    8:10 PM  Edinburgh Postnatal Depression Scale Screening Tool  I have been able to laugh and see the funny side of things. 0  I have looked forward with enjoyment to things. 0  I have blamed myself unnecessarily when things went wrong. 0  I have been anxious or worried for no good reason. 0  I have felt scared or panicky for no good reason. 0  Things have been getting on top of me. 0  I have been so unhappy that I have had difficulty sleeping. 0  I have felt sad or miserable. 0  I have been so unhappy that I have been crying. 0  The thought of harming myself has occurred to me. 0  Edinburgh Postnatal Depression Scale Total 0   Discharge instructions:  per After Visit Summary  After Visit Meds:  Allergies as of 04/24/2022   No Known Allergies      Medication List     TAKE these medications    PRENATAL  VITAMIN PO Take by mouth.       Activity: Advance as tolerated. Pelvic rest for 6 weeks.   Newborn Data: Live born female  Birth Weight: 7 lb 15.3 oz (3610 g) APGAR: 5, 9  Newborn Delivery   Birth date/time: 04/23/2022 12:52:00 Delivery type: Vaginal, Vacuum (Extractor)      Named Reagan Baby Feeding: Breast Disposition:home with mother  Delivery Report:  Review the Delivery Report for details.    Follow up:  Follow-up Information     Brien Few, MD. Schedule an appointment as soon as possible for a visit in 6 week(s).   Specialty: Obstetrics and Gynecology Contact information: [place] [address] [address], CNM, MSN 04/24/2022, 10:28 AM

## 2022-05-04 ENCOUNTER — Telehealth (HOSPITAL_COMMUNITY): Payer: Self-pay | Admitting: *Deleted

## 2022-05-04 NOTE — Telephone Encounter (Signed)
Mom reports feeling good. No concerns about herself at this time. EPDS not completed. Mom feels good emotionally. [facility] score=0) Mom reports baby is doing well. Feeding, peeing, and pooping without difficulty. Safe sleep reviewed. Mom reports no concerns about baby at present.  Odis Hollingshead, RN 05-04-2022 at 10:48am

## 2023-07-09 ENCOUNTER — Ambulatory Visit: Payer: Self-pay
